# Patient Record
Sex: Male | Born: 1975
Health system: Southern US, Community
[De-identification: ages and names within clinical notes are randomized; demographics above are authoritative.]

## PROBLEM LIST (undated history)

## (undated) DIAGNOSIS — D497 Neoplasm of unspecified behavior of endocrine glands and other parts of nervous system: Secondary | ICD-10-CM

## (undated) DIAGNOSIS — B019 Varicella without complication: Secondary | ICD-10-CM

## (undated) HISTORY — DX: Varicella without complication: B01.9

---

## 1998-05-30 HISTORY — PX: HAND SURGERY: SHX662

## 2001-05-30 HISTORY — PX: APPENDECTOMY: SHX54

## 2013-07-15 ENCOUNTER — Ambulatory Visit: Payer: Self-pay | Admitting: Physician Assistant

## 2013-07-15 LAB — RAPID INFLUENZA A&B ANTIGENS

## 2014-03-24 ENCOUNTER — Ambulatory Visit: Payer: Self-pay | Admitting: Physician Assistant

## 2016-01-06 ENCOUNTER — Encounter: Payer: Self-pay | Admitting: Family Medicine

## 2016-01-06 ENCOUNTER — Ambulatory Visit (INDEPENDENT_AMBULATORY_CARE_PROVIDER_SITE_OTHER): Payer: 59

## 2016-01-06 ENCOUNTER — Ambulatory Visit (INDEPENDENT_AMBULATORY_CARE_PROVIDER_SITE_OTHER): Payer: 59 | Admitting: Family Medicine

## 2016-01-06 VITALS — BP 116/74 | HR 70 | Temp 98.3°F | Ht 71.75 in | Wt 229.5 lb

## 2016-01-06 DIAGNOSIS — Z130 Encounter for screening for diseases of the blood and blood-forming organs and certain disorders involving the immune mechanism: Secondary | ICD-10-CM

## 2016-01-06 DIAGNOSIS — Z0001 Encounter for general adult medical examination with abnormal findings: Secondary | ICD-10-CM | POA: Diagnosis not present

## 2016-01-06 DIAGNOSIS — R200 Anesthesia of skin: Secondary | ICD-10-CM

## 2016-01-06 DIAGNOSIS — R202 Paresthesia of skin: Secondary | ICD-10-CM

## 2016-01-06 DIAGNOSIS — E669 Obesity, unspecified: Secondary | ICD-10-CM | POA: Diagnosis not present

## 2016-01-06 NOTE — Patient Instructions (Addendum)
We will call with your results.  Return for your testosterone (has been first thing in the morning).   Follow up annually  Take care  Dr. Lacinda Axon   Health Maintenance, Male A healthy lifestyle and preventative care can promote health and wellness.  Maintain regular health, dental, and eye exams.  Eat a healthy diet. Foods like vegetables, fruits, whole grains, low-fat dairy products, and lean protein foods contain the nutrients you need and are low in calories. Decrease your intake of foods high in solid fats, added sugars, and salt. Get information about a proper diet from your health care provider, if necessary.  Regular physical exercise is one of the most important things you can do for your health. Most adults should get at least 150 minutes of moderate-intensity exercise (any activity that increases your heart rate and causes you to sweat) each week. In addition, most adults need muscle-strengthening exercises on 2 or more days a week.   Maintain a healthy weight. The body mass index (BMI) is a screening tool to identify possible weight problems. It provides an estimate of body fat based on height and weight. Your health care provider can find your BMI and can help you achieve or maintain a healthy weight. For males 20 years and older:  A BMI below 18.5 is considered underweight.  A BMI of 18.5 to 24.9 is normal.  A BMI of 25 to 29.9 is considered overweight.  A BMI of 30 and above is considered obese.  Maintain normal blood lipids and cholesterol by exercising and minimizing your intake of saturated fat. Eat a balanced diet with plenty of fruits and vegetables. Blood tests for lipids and cholesterol should begin at age 81 and be repeated every 5 years. If your lipid or cholesterol levels are high, you are over age 14, or you are at high risk for heart disease, you may need your cholesterol levels checked more frequently.Ongoing high lipid and cholesterol levels should be treated  with medicines if diet and exercise are not working.  If you smoke, find out from your health care provider how to quit. If you do not use tobacco, do not start.  Lung cancer screening is recommended for adults aged 42-80 years who are at high risk for developing lung cancer because of a history of smoking. A yearly low-dose CT scan of the lungs is recommended for people who have at least a 30-pack-year history of smoking and are current smokers or have quit within the past 15 years. A pack year of smoking is smoking an average of 1 pack of cigarettes a day for 1 year (for example, a 30-pack-year history of smoking could mean smoking 1 pack a day for 30 years or 2 packs a day for 15 years). Yearly screening should continue until the smoker has stopped smoking for at least 15 years. Yearly screening should be stopped for people who develop a health problem that would prevent them from having lung cancer treatment.  If you choose to drink alcohol, do not have more than 2 drinks per day. One drink is considered to be 12 oz (360 mL) of beer, 5 oz (150 mL) of wine, or 1.5 oz (45 mL) of liquor.  Avoid the use of street drugs. Do not share needles with anyone. Ask for help if you need support or instructions about stopping the use of drugs.  High blood pressure causes heart disease and increases the risk of stroke. High blood pressure is more likely to develop  in:  People who have blood pressure in the end of the normal range (100-139/85-89 mm Hg).  People who are overweight or obese.  People who are African American.  If you are 59-63 years of age, have your blood pressure checked every 3-5 years. If you are 58 years of age or older, have your blood pressure checked every year. You should have your blood pressure measured twice--once when you are at a hospital or clinic, and once when you are not at a hospital or clinic. Record the average of the two measurements. To check your blood pressure when you  are not at a hospital or clinic, you can use:  An automated blood pressure machine at a pharmacy.  A home blood pressure monitor.  If you are 36-75 years old, ask your health care provider if you should take aspirin to prevent heart disease.  Diabetes screening involves taking a blood sample to check your fasting blood sugar level. This should be done once every 3 years after age 63 if you are at a normal weight and without risk factors for diabetes. Testing should be considered at a younger age or be carried out more frequently if you are overweight and have at least 1 risk factor for diabetes.  Colorectal cancer can be detected and often prevented. Most routine colorectal cancer screening begins at the age of 42 and continues through age 53. However, your health care provider may recommend screening at an earlier age if you have risk factors for colon cancer. On a yearly basis, your health care provider may provide home test kits to check for hidden blood in the stool. A small camera at the end of a tube may be used to directly examine the colon (sigmoidoscopy or colonoscopy) to detect the earliest forms of colorectal cancer. Talk to your health care provider about this at age 84 when routine screening begins. A direct exam of the colon should be repeated every 5-10 years through age 77, unless early forms of precancerous polyps or small growths are found.  People who are at an increased risk for hepatitis B should be screened for this virus. You are considered at high risk for hepatitis B if:  You were born in a country where hepatitis B occurs often. Talk with your health care provider about which countries are considered high risk.  Your parents were born in a high-risk country and you have not received a shot to protect against hepatitis B (hepatitis B vaccine).  You have HIV or AIDS.  You use needles to inject street drugs.  You live with, or have sex with, someone who has hepatitis  B.  You are a man who has sex with other men (MSM).  You get hemodialysis treatment.  You take certain medicines for conditions like cancer, organ transplantation, and autoimmune conditions.  Hepatitis C blood testing is recommended for all people born from [birth year] through 1965 and any individual with known risk factors for hepatitis C.  Healthy men should no longer receive prostate-specific antigen (PSA) blood tests as part of routine cancer screening. Talk to your health care provider about prostate cancer screening.  Testicular cancer screening is not recommended for adolescents or adult males who have no symptoms. Screening includes self-exam, a health care provider exam, and other screening tests. Consult with your health care provider about any symptoms you have or any concerns you have about testicular cancer.  Practice safe sex. Use condoms and avoid high-risk sexual practices to reduce the  spread of sexually transmitted infections (STIs).  You should be screened for STIs, including gonorrhea and chlamydia if:  You are sexually active and are younger than 24 years.  You are older than 24 years, and your health care provider tells you that you are at risk for this type of infection.  Your sexual activity has changed since you were last screened, and you are at an increased risk for chlamydia or gonorrhea. Ask your health care provider if you are at risk.  If you are at risk of being infected with HIV, it is recommended that you take a prescription medicine daily to prevent HIV infection. This is called pre-exposure prophylaxis (PrEP). You are considered at risk if:  You are a man who has sex with other men (MSM).  You are a heterosexual man who is sexually active with multiple partners.  You take drugs by injection.  You are sexually active with a partner who has HIV.  Talk with your health care provider about whether you are at high risk of being infected with HIV. If you  choose to begin PrEP, you should first be tested for HIV. You should then be tested every 3 months for as long as you are taking PrEP.  Use sunscreen. Apply sunscreen liberally and repeatedly throughout the day. You should seek shade when your shadow is shorter than you. Protect yourself by wearing long sleeves, pants, a wide-brimmed hat, and sunglasses year round whenever you are outdoors.  Tell your health care provider of new moles or changes in moles, especially if there is a change in shape or color. Also, tell your health care provider if a mole is larger than the size of a pencil eraser.  A one-time screening for abdominal aortic aneurysm (AAA) and surgical repair of large AAAs by ultrasound is recommended for men aged 49-75 years who are current or former smokers.  Stay current with your vaccines (immunizations).   This information is not intended to replace advice given to you by your health care provider. Make sure you discuss any questions you have with your health care provider.   Document Released: 11/12/2007 Document Revised: 06/06/2014 Document Reviewed: 10/11/2010 Elsevier Interactive Patient Education [employer].

## 2016-01-06 NOTE — Assessment & Plan Note (Signed)
Tetanus up-to-date. Screening labs today. We do not yet have flu vaccines.

## 2016-01-06 NOTE — Progress Notes (Signed)
 Subjective:  Patient ID: Jonathan Day, male    DOB: 06/25/1975  Age: 40 y.o. MRN: [id number]  CC: Establish care, Neck pain/thumb numbness  HPI Jonathan Day is a 40 y.o. male presents to the clinic today with the above complaints/issues.  Preventative Healthcare  Immunizations  Tetanus - Up to date.  Pneumococcal - Not indicated.   Flu - We don't have vaccine yet.  Prostate cancer screening: Not indicated at this time.  Labs: Screening labs today.  Exercise: No regular exercise.   Alcohol use: Yes.   Smoking/tobacco use: Nonsmoker.  Neck pain/R thumb numbness  Patient reports that he's had neck pain and associated right thumb numbness and tingling for the past 2 weeks. Intermittently severe.   No fall, trauma, injury.  Patient feels like his numbness starts in the cervical spine and radiates downward.  No known exacerbating or relieving factors.  No reported weakness or muscle atrophy.  No medications or interventions tried.  No other complaints this time.  PMH, Surgical Hx, Family Hx, Social History reviewed and updated as below.  Past Medical History:  Diagnosis Date  . Chicken pox    Past Surgical History:  Procedure Laterality Date  . APPENDECTOMY  2003  . HAND SURGERY Right 2000   Family History  Problem Relation Age of Onset  . Diabetes Mother   . Hyperlipidemia Mother   . Diabetes Maternal Grandmother   . Hypertension Maternal Grandfather    Social History  Substance Use Topics  . Smoking status: Never Smoker  . Smokeless tobacco: Never Used  . Alcohol use Yes   Review of Systems  Constitutional: Positive for fatigue.  Musculoskeletal: Positive for neck pain.  Neurological: Positive for numbness.  All other systems reviewed and are negative.  Objective:   Today's Vitals: BP 116/74 (BP Location: Right Arm, Patient Position: Sitting, Cuff Size: Normal)   Pulse 70   Temp 98.3 F (36.8 C) (Oral)   Ht 5' 11.75" (1.822 m)    Wt 229 lb 8 oz (104.1 kg)   SpO2 98%   BMI 31.34 kg/m   Physical Exam  Constitutional: He is oriented to person, place, and time. He appears well-developed. No distress.  HENT:  Head: Normocephalic and atraumatic.  Mouth/Throat: Oropharynx is clear and moist.  Eyes: Conjunctivae are normal.  Neck: Neck supple.  C spine with normal ROM. No tenderness.   Cardiovascular: Normal rate and regular rhythm.   No murmur heard. Pulmonary/Chest: Effort normal. He has no wheezes. He has no rales.  Abdominal: Soft. There is no tenderness. There is no rebound.  Musculoskeletal: Normal range of motion.  Neurological: He is alert and oriented to person, place, and time.  Normal upper extremity reflexes. Normal strength.  Psychiatric: He has a normal mood and affect.  Vitals reviewed.  Assessment & Plan:   Problem List Items Addressed This Visit    Encounter for preventative adult health care exam with abnormal findings - Primary    Tetanus up-to-date. Screening labs today. We do not yet have flu vaccines.      Numbness and tingling of right thumb    New problem. History suggestive of cervical radiculopathy. Obtaining x-ray today.       Relevant Orders   DG Cervical Spine Complete    Other Visit Diagnoses    Screening for deficiency anemia       Relevant Orders   CBC   Obesity (BMI 30.0-34.9)       Relevant Orders     Comp Met (CMET)   Lipid Profile   HgB A1c     Follow-up: Annually or sooner if needed (xray pending)  Jayce Cook DO [facility] [place]     

## 2016-01-06 NOTE — Progress Notes (Signed)
Pre visit review using our clinic review tool, if applicable. No additional management support is needed unless otherwise documented below in the visit note. 

## 2016-01-06 NOTE — Assessment & Plan Note (Signed)
New problem. History suggestive of cervical radiculopathy. Obtaining x-ray today.

## 2016-01-07 ENCOUNTER — Other Ambulatory Visit: Payer: Self-pay | Admitting: Family Medicine

## 2016-01-07 DIAGNOSIS — M5412 Radiculopathy, cervical region: Secondary | ICD-10-CM

## 2016-01-07 LAB — LIPID PANEL
CHOL/HDL RATIO: 5
CHOLESTEROL: 214 mg/dL — AB (ref 0–200)
HDL: 39.5 mg/dL (ref >39.00)
LDL CALC: 152 mg/dL — AB (ref 0–99)
NONHDL: 174.93
Triglycerides: 114 mg/dL (ref 0.0–149.0)
VLDL: 22.8 mg/dL (ref 0.0–40.0)

## 2016-01-07 LAB — COMPREHENSIVE METABOLIC PANEL
ALBUMIN: 4.4 g/dL (ref 3.5–5.2)
ALT: 23 U/L (ref 0–53)
AST: 35 U/L (ref 0–37)
Alkaline Phosphatase: 63 U/L (ref 39–117)
BUN: 22 mg/dL (ref 6–23)
CALCIUM: 9.4 mg/dL (ref 8.4–10.5)
CO2: 28 mEq/L (ref 19–32)
Chloride: 103 mEq/L (ref 96–112)
Creatinine, Ser: 1.07 mg/dL (ref 0.40–1.50)
GFR: 81.22 mL/min (ref >60.00)
Glucose, Bld: 107 mg/dL — ABNORMAL HIGH (ref 70–99)
POTASSIUM: 4 meq/L (ref 3.5–5.1)
Sodium: 137 mEq/L (ref 135–145)
TOTAL PROTEIN: 7.6 g/dL (ref 6.0–8.3)
Total Bilirubin: 0.3 mg/dL (ref 0.2–1.2)

## 2016-01-07 LAB — CBC
HEMATOCRIT: 45.2 % (ref 39.0–52.0)
HEMOGLOBIN: 15.6 g/dL (ref 13.0–17.0)
MCHC: 34.6 g/dL (ref 30.0–36.0)
MCV: 90.2 fl (ref 78.0–100.0)
PLATELETS: 292 10*3/uL (ref 150.0–400.0)
RBC: 5.01 Mil/uL (ref 4.22–5.81)
RDW: 11.2 % — AB (ref 11.5–15.5)
WBC: 7.8 10*3/uL (ref 4.0–10.5)

## 2016-01-07 LAB — HEMOGLOBIN A1C: HEMOGLOBIN A1C: 5.4 % (ref 4.6–6.5)

## 2016-01-19 ENCOUNTER — Telehealth: Payer: Self-pay | Admitting: Family Medicine

## 2016-01-19 ENCOUNTER — Other Ambulatory Visit: Payer: Self-pay | Admitting: Family Medicine

## 2016-01-19 MED ORDER — GABAPENTIN 300 MG PO CAPS: 300.0000 mg | ORAL_CAPSULE | Freq: Three times a day (TID) | ORAL | 3 refills | Status: DC

## 2016-01-19 NOTE — Telephone Encounter (Signed)
Pt [place] wife called wanting to know if the MRI is neccesary? Pt insurance will not cover the MRI. Please advise?   Call wife @ [phone]. Thank you!

## 2016-01-19 NOTE — Telephone Encounter (Signed)
Please advise 

## 2016-01-19 NOTE — Telephone Encounter (Signed)
Patient wife requested to be called, to better explain the reason for her questioning the MRi.

## 2016-01-19 NOTE — Telephone Encounter (Signed)
I recommend the MRI given his symptoms and xray findings. Insurance declined (likely due to not trying medication for a period of time).

## 2016-01-20 NOTE — Telephone Encounter (Signed)
The insurance is Christella Scheuermann the number for dispute claims :[phone]  Please call wife with a questions [phone]

## 2016-01-20 NOTE — Telephone Encounter (Signed)
Please advise 

## 2016-01-20 NOTE — Telephone Encounter (Signed)
Patients wife spoke to [employer], you were correct.  Insurance would like to use conservative methods first.  But the provider did mention that since there was no mention of the bone spur, Dr. Lacinda Axon could call and speak to the provider at the insurance to rectify the situation.  Patient will be calling back within the hour to provide the number and extension for Dr. Lacinda Axon to call.

## 2016-01-20 NOTE — Telephone Encounter (Signed)
Can I have a CMA or Tanya call for me.  I cannot sit on the phone all day disputing a claim.

## 2016-01-21 NOTE — Telephone Encounter (Signed)
Called but was unable to leave message.

## 2016-01-22 NOTE — Telephone Encounter (Signed)
Left message for patient to return call back.  

## 2016-01-26 NOTE — Telephone Encounter (Signed)
Left message for patient to return call back.  

## 2016-02-03 ENCOUNTER — Telehealth: Payer: Self-pay | Admitting: *Deleted

## 2016-02-03 NOTE — Telephone Encounter (Signed)
Pt wife has further questions, please call today at [phone]

## 2016-02-03 NOTE — Telephone Encounter (Signed)
Patients wife is wondering if they can receive referral or medication to meet criteria to be able to receive MRI.  Patient was seen By Dr. Lacinda Axon for [place]. Please advise.

## 2016-02-03 NOTE — Telephone Encounter (Signed)
Patient called to schedule appointment for further treatment.

## 2016-02-04 ENCOUNTER — Other Ambulatory Visit: Payer: Self-pay | Admitting: Family Medicine

## 2016-02-04 NOTE — Telephone Encounter (Signed)
We can try a trial of gabapentin. Ok with that?

## 2016-02-04 NOTE — Telephone Encounter (Signed)
Yes that's ok. Patient will be making FU appointment at a later date for FU. To kinda start the process.

## 2016-02-04 NOTE — Telephone Encounter (Signed)
Rx has been sent  

## 2016-05-22 ENCOUNTER — Other Ambulatory Visit: Payer: Self-pay | Admitting: Family Medicine

## 2016-05-24 NOTE — Telephone Encounter (Signed)
Last OV 8/17 ok to fill Neurontin ?

## 2016-09-01 ENCOUNTER — Other Ambulatory Visit: Payer: Self-pay | Admitting: Family Medicine

## 2016-09-01 MED ORDER — GABAPENTIN 300 MG PO CAPS: 300.0000 mg | ORAL_CAPSULE | Freq: Three times a day (TID) | ORAL | 3 refills | Status: DC

## 2016-09-01 NOTE — Telephone Encounter (Signed)
Refilled: 05/25/16 Last OV: 01/06/16 Last Labs:  Future OV: none Please advise?

## 2016-10-19 ENCOUNTER — Other Ambulatory Visit: Payer: Self-pay | Admitting: Family Medicine

## 2017-02-14 ENCOUNTER — Encounter: Payer: Self-pay | Admitting: Family Medicine

## 2017-02-14 ENCOUNTER — Ambulatory Visit (INDEPENDENT_AMBULATORY_CARE_PROVIDER_SITE_OTHER): Payer: 59 | Admitting: Family Medicine

## 2017-02-14 DIAGNOSIS — D497 Neoplasm of unspecified behavior of endocrine glands and other parts of nervous system: Secondary | ICD-10-CM | POA: Insufficient documentation

## 2017-02-14 DIAGNOSIS — M5412 Radiculopathy, cervical region: Secondary | ICD-10-CM

## 2017-02-14 DIAGNOSIS — Z23 Encounter for immunization: Secondary | ICD-10-CM

## 2017-02-14 MED ORDER — PREDNISONE 10 MG (48) PO TBPK: ORAL_TABLET | ORAL | 0 refills | Status: DC

## 2017-02-14 MED ORDER — TRAMADOL HCL 50 MG PO TABS: 50.0000 mg | ORAL_TABLET | Freq: Three times a day (TID) | ORAL | 0 refills | Status: DC | PRN

## 2017-02-14 MED ORDER — SELENIUM SULFIDE 2.5 % EX LOTN: TOPICAL_LOTION | CUTANEOUS | 12 refills | Status: DC

## 2017-02-14 NOTE — Progress Notes (Signed)
Subjective:  Patient ID: Jonathan Day, male    DOB: 03-Aug-1975  Age: 41 y.o. MRN: [id number]  CC: Neck pain, numbness  HPI:  41 year old male presents with the above complaints.  Patient has seen me for this previously.  He states that he improved following treatment with gabapentin. He states that since Wednesday of last week, he's had an acute exacerbation. He's had right-sided neck pain with radiation down his arm. Associated paresthesias of the right arm and thumb. Severe. He is having trouble getting comfortable. He's tried over-the-counter ibuprofen as well as his gabapentin without resolution. He is also gotten a massage and no improvement. No reported weakness. No other associated symptoms. No other complaints at this time.  Social Hx   Social History   Social History  . Marital status: Married    Spouse name: N/A  . Number of children: N/A  . Years of education: N/A   Social History Main Topics  . Smoking status: Never Smoker  . Smokeless tobacco: Never Used  . Alcohol use Yes  . Drug use: No  . Sexual activity: Yes    Partners: Female   Other Topics Concern  . None   Social History Narrative  . None    Review of Systems  Constitutional: Negative.   Musculoskeletal: Positive for neck pain.  Neurological: Positive for numbness.   Objective:  BP 120/78 (BP Location: Left Arm, Patient Position: Sitting, Cuff Size: Normal)   Pulse 81   Temp 98.2 F (36.8 C) (Oral)   Resp 12   Wt 208 lb 4 oz (94.5 kg)   SpO2 98%   BMI 28.44 kg/m   BP/Weight 2/59/5638 12/02/6431  Systolic BP 295 188  Diastolic BP 78 74  Wt. (Lbs) 208.25 229.5  BMI 28.44 31.34    Physical Exam  Constitutional: He is oriented to person, place, and time. He appears well-developed.  Neck:  Decreased ROM of the neck.  Cardiovascular: Normal rate and regular rhythm.   Pulmonary/Chest: Effort normal. He has no wheezes. He has no rales.  Neurological: He is alert and oriented to  person, place, and time.  Psychiatric: He has a normal mood and affect.  Vitals reviewed.   Lab Results  Component Value Date   WBC 7.8 01/06/2016   HGB 15.6 01/06/2016   HCT 45.2 01/06/2016   PLT 292.0 01/06/2016   GLUCOSE 107 (H) 01/06/2016   CHOL 214 (H) 01/06/2016   TRIG 114.0 01/06/2016   HDL 39.50 01/06/2016   LDLCALC 152 (H) 01/06/2016   ALT 23 01/06/2016   AST 35 01/06/2016   NA 137 01/06/2016   K 4.0 01/06/2016   CL 103 01/06/2016   CREATININE 1.07 01/06/2016   BUN 22 01/06/2016   CO2 28 01/06/2016   HGBA1C 5.4 01/06/2016    Assessment & Plan:   Problem List Items Addressed This Visit    Cervical radiculopathy    Established problem, with recent worsening. Treating with prednisone taper, Tramadol. Arranging MRI.      Relevant Orders   MR Cervical Spine Wo Contrast    Other Visit Diagnoses    Need for immunization against influenza       Relevant Orders   Flu Vaccine QUAD 36+ mos IM (Completed)      Meds ordered this encounter  Medications  . CABERGOLINE PO    Sig: Take by mouth.  . selenium sulfide (SELSUN) 2.5 % shampoo    Sig: Apply to affected area and lather with small  amounts of water; leave on skin for 10 minutes, then rinse thoroughly    Dispense:  118 mL    Refill:  12  . predniSONE (STERAPRED UNI-PAK 48 TAB) 10 MG (48) TBPK tablet    Sig: Per package instructions; 12 day taper.    Dispense:  48 tablet    Refill:  0  . traMADol (ULTRAM) 50 MG tablet    Sig: Take 1 tablet (50 mg total) by mouth every 8 (eight) hours as needed.    Dispense:  30 tablet    Refill:  0   Follow-up: PRN  [place]

## 2017-02-14 NOTE — Assessment & Plan Note (Signed)
Established problem, with recent worsening. Treating with prednisone taper, Tramadol. Arranging MRI.

## 2017-02-14 NOTE — Patient Instructions (Signed)
Medications as prescribed.  We will call about the MRI.  Take care  Dr. Lacinda Axon

## 2017-02-23 ENCOUNTER — Telehealth: Payer: Self-pay | Admitting: *Deleted

## 2017-02-23 NOTE — Telephone Encounter (Signed)
Patient is requesting more prednisone. His taper will be gone before Monday. Patient is scheduled for MRI on 02/25/2017. Patient stated that the prednisone helped him before he tapered down to the 20 mg but he is going back to work Monday and he feels that the pain will get worse with all the picking up, bending, etc.

## 2017-02-23 NOTE — Telephone Encounter (Signed)
Patient has already had a prolonged steroid taper. I would not suggest a repeat steroid treatment at this time. There are risks with prolonged steroid use. He should continue with his other treatments for this and get the MRI as scheduled.

## 2017-02-23 NOTE — Telephone Encounter (Signed)
Pt has requested a call to discuss having another round of prednisone for his neck issues  Pt contact [phone]

## 2017-02-25 ENCOUNTER — Ambulatory Visit
Admission: RE | Admit: 2017-02-25 | Discharge: 2017-02-25 | Disposition: A | Discharge status: Home / Self Care | Payer: 59 | Source: Ambulatory Visit | Attending: Family Medicine | Admitting: Family Medicine

## 2017-02-25 DIAGNOSIS — M5412 Radiculopathy, cervical region: Secondary | ICD-10-CM | POA: Diagnosis present

## 2017-02-25 DIAGNOSIS — M4802 Spinal stenosis, cervical region: Secondary | ICD-10-CM | POA: Diagnosis not present

## 2017-02-25 DIAGNOSIS — M50122 Cervical disc disorder at C5-C6 level with radiculopathy: Secondary | ICD-10-CM | POA: Insufficient documentation

## 2017-02-28 ENCOUNTER — Telehealth: Payer: Self-pay | Admitting: *Deleted

## 2017-02-28 DIAGNOSIS — M5412 Radiculopathy, cervical region: Secondary | ICD-10-CM

## 2017-02-28 DIAGNOSIS — M5030 Other cervical disc degeneration, unspecified cervical region: Secondary | ICD-10-CM

## 2017-02-28 NOTE — Telephone Encounter (Signed)
Pt's wife has requested a call in reference to questions about the [place]  [phone] or [phone] -work

## 2017-02-28 NOTE — Telephone Encounter (Signed)
Pt's wife has requested to have a referral to a neurosurgeon in [place] , [place] [facility]- Dr. Josetta Huddle [phone]

## 2017-02-28 NOTE — Telephone Encounter (Signed)
Referral placed.

## 2017-03-02 NOTE — Telephone Encounter (Signed)
Pt's wife requested a call [phone]

## 2017-03-02 NOTE — Telephone Encounter (Signed)
Pt wife called wanting to know if pt can be put on a steroid taper until his appt? To release pressure and swelling on the nerve. Pt is having continuous numbness with right thumb. Please advise?  Call wife @ [phone]. Thank you!

## 2017-03-02 NOTE — Telephone Encounter (Signed)
Left voice mail to call back 

## 2017-03-02 NOTE — Telephone Encounter (Signed)
Patients wife advised of below and verbalized understanding  

## 2017-03-02 NOTE — Telephone Encounter (Signed)
Please find out when his appointment is. I would not recommend going on a steroid taper for any significant period of time. I'm happy to reevaluate him in the office to consider other treatments. If patient develops numbness other than just in his thumb he needs to be evaluated or if he develops any new symptoms he needs to be evaluated.

## 2017-03-02 NOTE — Telephone Encounter (Signed)
Spoke with patient advised of below.  He doesn't have appointment scheduled with neurosurgery yet, they're still reviewing records.   Patient states at times pain is in palm of hand and all fingers. Neurontin not really helping control pain.  Appointment scheduled for Wednesday.

## 2017-03-04 NOTE — Telephone Encounter (Signed)
Plan to evaluate in the office.

## 2017-03-07 ENCOUNTER — Telehealth: Payer: Self-pay | Admitting: Family Medicine

## 2017-03-07 NOTE — Telephone Encounter (Signed)
Copy of CD has been made and put up front in folder for pt to pick up.

## 2017-03-07 NOTE — Telephone Encounter (Signed)
Pt spouse called and is requesting a copy of his last xray as he as an appt with a specialist. Pt has an appt with Dr. Caryl Bis tomorrow. Can we have ready by his appt? Please advise, thank you!

## 2017-03-08 ENCOUNTER — Ambulatory Visit (INDEPENDENT_AMBULATORY_CARE_PROVIDER_SITE_OTHER): Payer: 59 | Admitting: Family Medicine

## 2017-03-08 ENCOUNTER — Encounter: Payer: Self-pay | Admitting: Family Medicine

## 2017-03-08 DIAGNOSIS — M5412 Radiculopathy, cervical region: Secondary | ICD-10-CM

## 2017-03-08 MED ORDER — PREDNISONE 10 MG PO TABS: ORAL_TABLET | ORAL | 0 refills | Status: DC

## 2017-03-08 MED ORDER — CYCLOBENZAPRINE HCL 10 MG PO TABS: 10.0000 mg | ORAL_TABLET | Freq: Three times a day (TID) | ORAL | 0 refills | Status: DC | PRN

## 2017-03-08 NOTE — Assessment & Plan Note (Signed)
Established problem. Nerve impingement is the likely cause based on MRI. Stable from most recent exam with Dr. Lacinda Axon. It improved somewhat with prednisone taper though recurred. Does have numbness in his right thumb though no weakness. Patient reports he has an appointment with orthopedics early next week for evaluation. Also had an appointment with neurosurgery though this is not until the end of November. We will trial another short taper of prednisone. Trial Flexeril as a muscle relaxer. Discussed that if he were to develop any weakness or worsening symptoms to be evaluated immediately.

## 2017-03-08 NOTE — Progress Notes (Signed)
[name], MD Phone: [phone]  Jonathan Day is a 41 y.o. male who presents today for follow-up.  Patient previously seen by Dr. Lacinda Axon for neck pain that radiates down towards his right thumb through his right arm. He had an MRI that revealed degenerative disc changes with moderate right neural foraminal stenosis at C5-C6. He had similar symptoms 8 or 9 months ago though had no pain. His arm would fall sleep at that time. He had no issues for a while on gabapentin though a little over a month ago started to have issues with pain in his neck. He woke up one day with pain in his neck that would shoot down towards his right thumb. He'll stretch and it will knees off. Work intensifies it. He was treated with a prednisone taper that was beneficial for several days last month though the issue came back. He notes numbness in his right thumb that is persistent. Feels like pins and needles. Notes on one or 2 occasions has felt possible weakness though he thinks it's related to the numbness.  ROS see history of present illness  Objective  Physical Exam Vitals:   03/08/17 1303  BP: 128/90  Pulse: 69  Temp: 98 F (36.7 C)  SpO2: 98%    BP Readings from Last 3 Encounters:  03/08/17 128/90  02/14/17 120/78  01/06/16 116/74   Wt Readings from Last 3 Encounters:  03/08/17 207 lb 6.4 oz (94.1 kg)  02/14/17 208 lb 4 oz (94.5 kg)  01/06/16 229 lb 8 oz (104.1 kg)    Physical Exam  Constitutional: No distress.  HENT:  Head: Normocephalic and atraumatic.  Cardiovascular: Normal rate, regular rhythm and normal heart sounds.   Pulmonary/Chest: Effort normal and breath sounds normal.  Musculoskeletal:  No midline neck tenderness, no midline neck step-off, no muscular neck tenderness, discomfort on rotational range of motion of the right, no discomfort other range of motion, rotational range of motion of the right slightly decreased related to discomfort, Spurling's with discomfort in  his neck though nothing shooting down his arm on the right  Neurological: He is alert.  5/5 strength in bilateral biceps, triceps, grip, quads, hamstrings, plantar and dorsiflexion, slight decreased light touch sensation in right thumb otherwise sensation to light touch intact in bilateral UE and LE  Skin: He is not diaphoretic.     Assessment/Plan: Please see individual problem list.  Cervical radiculopathy Established problem. Nerve impingement is the likely cause based on MRI. Stable from most recent exam with Dr. Lacinda Axon. It improved somewhat with prednisone taper though recurred. Does have numbness in his right thumb though no weakness. Patient reports he has an appointment with orthopedics early next week for evaluation. Also had an appointment with neurosurgery though this is not until the end of November. We will trial another short taper of prednisone. Trial Flexeril as a muscle relaxer. Discussed that if he were to develop any weakness or worsening symptoms to be evaluated immediately.   No orders of the defined types were placed in this encounter.   Meds ordered this encounter  Medications  . cyclobenzaprine (FLEXERIL) 10 MG tablet    Sig: Take 1 tablet (10 mg total) by mouth 3 (three) times daily as needed for muscle spasms.    Dispense:  30 tablet    Refill:  0  . predniSONE (DELTASONE) 10 MG tablet    Sig: Take 50 mg (5 tablets) by mouth for 2 days, then take 40 mg (4 tablets) by  mouth for 2 days, then take 30 mg (3 tablets) by mouth for 2 days, then take 20 mg (2 tablets) by mouth for 2 days, then take 10 mg (one tablet) by mouth for 2 days    Dispense:  30 tablet    Refill:  0   [name], MD [place]

## 2017-03-08 NOTE — Patient Instructions (Signed)
Nice to see you. Please keep your appointment with orthopedics next week. Please try the prednisone taper. You can try the muscle relaxer as well. This may make you drowsy so be careful. Please do not take any over-the-counter anti-inflammatories while taking the prednisone.

## 2017-03-20 ENCOUNTER — Other Ambulatory Visit: Payer: Self-pay | Admitting: Family Medicine

## 2017-03-21 MED ORDER — TRAMADOL HCL 50 MG PO TABS: 50.0000 mg | ORAL_TABLET | Freq: Three times a day (TID) | ORAL | 0 refills | Status: DC | PRN

## 2017-03-21 MED ORDER — CYCLOBENZAPRINE HCL 10 MG PO TABS: 10.0000 mg | ORAL_TABLET | Freq: Three times a day (TID) | ORAL | 0 refills | Status: DC | PRN

## 2017-03-21 NOTE — Telephone Encounter (Signed)
Last OV was 10/10, Please advise, thanks

## 2017-03-21 NOTE — Telephone Encounter (Signed)
LAst seen on 10/10, please advise thanks

## 2017-04-07 ENCOUNTER — Telehealth: Payer: Self-pay | Admitting: Family Medicine

## 2017-04-07 NOTE — Telephone Encounter (Signed)
Can we place referral to have this done?

## 2017-04-07 NOTE — Telephone Encounter (Signed)
Copied from [place] #[id number]. Topic: Quick Communication - See Telephone Encounter >> Apr 07, 2017  9:30 AM Burnis Medin, NT wrote: CRM for notification. See Telephone encounter for: Patients wife called and wanted to see if her husband can come in for an appointment to get his sperm count checked or if he have to go to a specialist. Pt. Had a tumor and was talking medication that causes his hormones to go back to normal. Pt. Would like a call back.  04/07/17.

## 2017-04-07 NOTE — Telephone Encounter (Signed)
Please contact the patient's wife and get more details regarding this.  Are they referring to the pituitary tumor?  What medication they referring to?  I prefer to refer him out for this.  Thanks.

## 2017-04-07 NOTE — Telephone Encounter (Signed)
LMTCB CRM started

## 2017-04-10 ENCOUNTER — Encounter: Payer: Self-pay | Admitting: *Deleted

## 2017-04-10 NOTE — Telephone Encounter (Signed)
Pt's wife, Junie Panning called concerning follow up of her husband's sperm count since it had been a year since he was on medication, taborgoline, to treat pituitary tumor. Pt wanted to know if she needed a referral and if so would the office would be wiling to give the referral or if this was something that could be done at the office. Pt can be contacted via Mychart due to work schedule and inability to answer phone at work or pt's wife Junie Panning, can be contacted at [phone], for further questions regarding the issue.

## 2017-04-11 ENCOUNTER — Other Ambulatory Visit: Payer: Self-pay | Admitting: Family Medicine

## 2017-04-13 NOTE — Telephone Encounter (Signed)
Last OV 03/08/17 last filled  Cyclobenzaprine 03/21/17 30 0rf Tramadol 03/21/17 30 0rf

## 2017-04-14 NOTE — Telephone Encounter (Signed)
Patient states he will call neurosurgery

## 2017-04-14 NOTE — Telephone Encounter (Signed)
Per review of Dr Ellen Henri last note, this was given for acute worsening pain.  Per note, pt had appt scheduled with ortho the next week.  If evaluated by ortho (and per note neurosurgery), I assume they are following and controlling pain med  Let me know if problems.

## 2017-04-14 NOTE — Telephone Encounter (Signed)
Patient has one day of medication left

## 2017-04-15 ENCOUNTER — Other Ambulatory Visit: Payer: Self-pay | Admitting: Family Medicine

## 2017-04-19 ENCOUNTER — Telehealth: Payer: Self-pay

## 2017-04-19 NOTE — Telephone Encounter (Signed)
FYI   Copied from [place] [id number]. Topic: General - Other >> Apr 19, 2017  3:46 PM Vernona Rieger wrote: [facility] called and said the Dr at their office is not filling his pain medicine, he is not currently under his care. Dr Caryl Bis would have to resume his med prescribing. Please advise.  He is no longer following up anymore with the Neuro Dr. Marcelline Deist office said you can call them back if you have questions but she just wanted Dr Caryl Bis to know. Vayas

## 2017-04-20 ENCOUNTER — Other Ambulatory Visit: Payer: Self-pay | Admitting: Family Medicine

## 2017-04-21 ENCOUNTER — Other Ambulatory Visit: Payer: Self-pay | Admitting: Family Medicine

## 2017-04-25 NOTE — Telephone Encounter (Signed)
Last OV 03/08/17 last filled  Tramadol 03/21/17 30 0rf cyclobenzaprine 03/21/17 30 0rf

## 2017-04-25 NOTE — Telephone Encounter (Signed)
Noted.  Meds refilled.

## 2017-04-25 NOTE — Telephone Encounter (Signed)
It is not testing we typically do in the office.  Did he have testing for low sperm count done previously?  We can certainly try to find a place to send him to for this issue and I will send a message to [facility] to see if she knows of somewhere where he can have this testing done.  Thanks.

## 2017-04-25 NOTE — Telephone Encounter (Signed)
Refills given.  Please fax to pharmacy.  Please check with the patient to see how he is doing with his symptoms.  Please see if the patient's symptoms have improved with physical therapy.

## 2017-04-26 NOTE — Telephone Encounter (Signed)
Patient states he has improved but still has some numbness. Patient states he has some bad days but not like it was.

## 2017-04-26 NOTE — Telephone Encounter (Signed)
Yes, patient was wanting to go for testing for a sperm count. He said he previously discussed in an appointment with you.

## 2017-04-27 NOTE — Telephone Encounter (Signed)
He may have discussed this with Dr Lacinda Axon. I will send this to Melissa to try to figure out where to send him for this. I think he may just need to follow up with endocrinology.

## 2017-04-28 NOTE — Telephone Encounter (Signed)
He would need to go to endo or reproductive endocrinology and infertility clinic. Referral isn't necessary for this, they can call anyone they want to schedule an appointment. [facility] has an excellent REI clinic. I don't see anything in his notes regarding sperm count.

## 2017-04-28 NOTE — Telephone Encounter (Signed)
Please let the patient know that they should contact the endocrinlogy office that he saw at [facility] previously to see if he can be seen for this there. He does not need a referral. Thanks.

## 2017-05-01 ENCOUNTER — Encounter: Payer: Self-pay | Admitting: *Deleted

## 2017-05-01 NOTE — Telephone Encounter (Signed)
MYchart message sent.

## 2017-06-22 ENCOUNTER — Other Ambulatory Visit: Payer: Self-pay | Admitting: Family Medicine

## 2017-06-23 ENCOUNTER — Telehealth: Payer: Self-pay

## 2017-06-23 MED ORDER — TRAMADOL HCL 50 MG PO TABS: 50.0000 mg | ORAL_TABLET | Freq: Three times a day (TID) | ORAL | 0 refills | Status: DC | PRN

## 2017-06-23 MED ORDER — CYCLOBENZAPRINE HCL 10 MG PO TABS: 10.0000 mg | ORAL_TABLET | Freq: Three times a day (TID) | ORAL | 0 refills | Status: DC | PRN

## 2017-06-23 NOTE — Telephone Encounter (Signed)
Please fax.  I would suggest follow-up if he is continuing to have issues with his neck.  Thanks.

## 2017-06-23 NOTE — Telephone Encounter (Signed)
Last OV 03/08/17 last filled  Tramadol 04/25/17 30 0rf Cyclobenzaprine 04/25/17 30 0rf

## 2017-06-23 NOTE — Telephone Encounter (Signed)
Copied from [place] [id number]. Topic: Quick Communication - See Telephone Encounter >> Jun 23, 2017  7:03 AM Hewitt Shorts wrote: CRM for notification. See Telephone encounter for:  Pt is asking for an AWV and a normal office visit but a CRM needs to be added to patient in order to schedule appt for AWV  06/23/17. >> Jun 23, 2017  8:40 AM Larey Days wrote: The patient is 42yrs old he does not need a AWV . I left a message to give me a call back to schedule his physical and office visit.

## 2017-06-26 ENCOUNTER — Encounter: Payer: Self-pay | Admitting: *Deleted

## 2017-06-26 NOTE — Telephone Encounter (Signed)
Sent mychart message

## 2017-08-15 ENCOUNTER — Other Ambulatory Visit: Payer: Self-pay

## 2017-08-15 NOTE — Telephone Encounter (Signed)
Last OV 03/08/17 last filled 09/01/16 by Dr.Cook 90 3rf

## 2017-08-15 NOTE — Telephone Encounter (Signed)
Please see if he is continuing to take this given that it has not been refilled in some time.  Thanks.

## 2017-08-16 NOTE — Telephone Encounter (Signed)
Patient states he did not request this.

## 2017-08-18 ENCOUNTER — Other Ambulatory Visit: Payer: Self-pay

## 2017-08-18 ENCOUNTER — Encounter: Payer: Self-pay | Admitting: Family Medicine

## 2017-08-18 ENCOUNTER — Encounter: Payer: Self-pay | Admitting: *Deleted

## 2017-08-18 ENCOUNTER — Ambulatory Visit: Payer: 59 | Admitting: Family Medicine

## 2017-08-18 VITALS — BP 130/88 | HR 97 | Temp 98.1°F | Ht 72.0 in | Wt 214.6 lb

## 2017-08-18 DIAGNOSIS — R7309 Other abnormal glucose: Secondary | ICD-10-CM

## 2017-08-18 DIAGNOSIS — R030 Elevated blood-pressure reading, without diagnosis of hypertension: Secondary | ICD-10-CM | POA: Diagnosis not present

## 2017-08-18 DIAGNOSIS — Z125 Encounter for screening for malignant neoplasm of prostate: Secondary | ICD-10-CM

## 2017-08-18 DIAGNOSIS — E785 Hyperlipidemia, unspecified: Secondary | ICD-10-CM

## 2017-08-18 DIAGNOSIS — M5412 Radiculopathy, cervical region: Secondary | ICD-10-CM

## 2017-08-18 MED ORDER — CYCLOBENZAPRINE HCL 10 MG PO TABS: 10.0000 mg | ORAL_TABLET | Freq: Three times a day (TID) | ORAL | 0 refills | Status: DC | PRN

## 2017-08-18 NOTE — Assessment & Plan Note (Signed)
Discussed prostate cancer screening beginning around age 42 given his ethnicity leading to increased risk for prostate cancer.  Discussed the risks and the benefits of testing and the limitations of testing.  He opted for checking a PSA.

## 2017-08-18 NOTE — Patient Instructions (Signed)
Nice to see you. Please let us know if your insurance covers massage and I can provide you with a letter. We will contact our sports medicine physician and see what they think about seeing you. If you develop worsening symptoms or you develop weakness please let us or your surgeons know.

## 2017-08-18 NOTE — Assessment & Plan Note (Signed)
Pretty significantly improved though still occasionally having symptoms.  He will continue physical therapy.  Flexeril refill given.  We will send a message to sports medicine to see if the patient would potentially benefit from seeing them and considering manipulation through sports medicine.

## 2017-08-18 NOTE — Assessment & Plan Note (Signed)
Blood pressure borderline elevated for age initially.  Improved on recheck.  Will start checking it at home consistently.  He has not been able to be as active as prior given his recent neck issues.  He will follow-up in 3 months and see what his blood pressure has been doing.  If persistently elevated at home he will contact us prior to then.

## 2017-08-18 NOTE — Progress Notes (Signed)
[name], MD Phone: [phone]  Jonathan Day is a 42 y.o. male who presents today for f/u.  He notes his neck is doing quite a bit better.  He still has some numbness in the dorsal aspect of his right thumb though the tingling has significantly improved.  The numbness is improved as well.  No weakness.  Has been seeing physical therapy and they have been doing electrical stimulation as well as dry needling.  Also manual traction.  He did see neurosurgery as well as orthopedics and they both said he was a candidate for surgery though recommended physical therapy first.  Notes it will occasionally bother him a little more if he sleeps on it incorrectly.  He is thinking about getting a different pillow that cups has head a little bit better.  He takes the Flexeril prior to treatment at times.  Tramadol rarely.  Neither of these make him drowsy.  He wonders if there is anything else he can do for this.  Blood pressure borderline today.  No history of hypertension.  No chest pain or shortness of breath.  Social History   Tobacco Use  Smoking Status Never Smoker  Smokeless Tobacco Never Used     ROS see history of present illness  Objective  Physical Exam Vitals:   08/18/17 0826 08/18/17 0855  BP: 140/70 130/88  Pulse: 97   Temp: 98.1 F (36.7 C)   SpO2: 99%     BP Readings from Last 3 Encounters:  08/18/17 130/88  03/08/17 128/90  02/14/17 120/78   Wt Readings from Last 3 Encounters:  08/18/17 214 lb 9.6 oz (97.3 kg)  03/08/17 207 lb 6.4 oz (94.1 kg)  02/14/17 208 lb 4 oz (94.5 kg)    Physical Exam  Constitutional: No distress.  Cardiovascular: Normal rate, regular rhythm and normal heart sounds.  Pulmonary/Chest: Effort normal and breath sounds normal.  Musculoskeletal: He exhibits no edema.  No midline spine tenderness, no midline spine step-off, there is muscular tenderness and spasm in the midportion of his right trapezius muscle, no other tenderness,  5/5 strength in bilateral biceps, triceps, grip, quads, hamstrings, plantar and dorsiflexion, slight decreased sensation light touch over the dorsal aspect of his right thumb, otherwise sensation to light touch intact in bilateral UE and LE  Neurological: He is alert. Gait normal.  Skin: Skin is warm and dry. He is not diaphoretic.     Assessment/Plan: Please see individual problem list.  Cervical radiculopathy Pretty significantly improved though still occasionally having symptoms.  He will continue physical therapy.  Flexeril refill given.  We will send a message to sports medicine to see if the patient would potentially benefit from seeing them and considering manipulation through sports medicine.  Borderline blood pressure Blood pressure borderline elevated for age initially.  Improved on recheck.  Will start checking it at home consistently.  He has not been able to be as active as prior given his recent neck issues.  He will follow-up in 3 months and see what his blood pressure has been doing.  If persistently elevated at home he will contact us prior to then.  Prostate cancer screening Discussed prostate cancer screening beginning around age 60 given his ethnicity leading to increased risk for prostate cancer.  Discussed the risks and the benefits of testing and the limitations of testing.  He opted for checking a PSA.  Orders Placed This Encounter  Procedures  . Lipid panel    Standing Status:   Future  Standing Expiration Date:   08/19/2018  . Comp Met (CMET)    Standing Status:   Future    Standing Expiration Date:   08/19/2018  . HgB A1c    Standing Status:   Future    Standing Expiration Date:   08/19/2018  . PSA    Standing Status:   Future    Standing Expiration Date:   08/19/2018    Meds ordered this encounter  Medications  . cyclobenzaprine (FLEXERIL) 10 MG tablet    Sig: Take 1 tablet (10 mg total) by mouth 3 (three) times daily as needed for muscle spasms.     Dispense:  30 tablet    Refill:  0     [name], MD [place]

## 2017-08-19 ENCOUNTER — Other Ambulatory Visit: Payer: Self-pay | Admitting: Family Medicine

## 2017-08-20 ENCOUNTER — Telehealth: Payer: Self-pay | Admitting: Family Medicine

## 2017-08-20 DIAGNOSIS — M5412 Radiculopathy, cervical region: Secondary | ICD-10-CM

## 2017-08-20 NOTE — Telephone Encounter (Signed)
Please let the patient know I heard back from Dr. Tamala Julian of sports medicine and he noted he felt he likely would be able to help the patient.  I have placed a referral.  He should be contacted regarding this in the next week or so.  Thanks.

## 2017-08-20 NOTE — Telephone Encounter (Signed)
-----   Message from Lyndal Pulley, DO sent at 08/19/2017  1:34 PM EDT ----- Marykay Lex man! Yeah he would likely be a good candidate, I still do a ton of it.  Send him my way and I will do what I can! Thanks Thedore Mins  ----- Message ----- From: Leone Haven, MD Sent: 08/18/2017  12:16 PM To: Lyndal Pulley, DO  Langston Reusing,   I hope all is well. I wanted to see if you are doing manipulation in clinic? If so, I wanted to see if you thought it would be beneficial for this patient to see you in clinic. He has had right arm radicular symptoms for a number of months associated with right sided neck pain. Notes prior pain and numbness in to his right dorsal thumb radiating from his neck. He underwent MRI that revealed right sided C6 nerve impingement. He saw ortho and neurosurgery and they both said he was a candidate for surgical intervention though recommended PT and conservative treatment first. He has improved with these measures, though seems to have stalled with occasional discomfort and mild continued numbness in the dorsal aspect of his right thumb. He wondered if there was anything else to try at this point. I though he might benefit from manipulation, though wanted to get your input prior to referring to you. Thanks for your help.  Randall Hiss

## 2017-08-21 NOTE — Telephone Encounter (Signed)
Left message to return call, ok for pec to speak to patient 

## 2017-08-29 NOTE — Telephone Encounter (Signed)
Patient notified

## 2017-09-05 ENCOUNTER — Ambulatory Visit: Payer: 59 | Admitting: Family Medicine

## 2017-09-07 ENCOUNTER — Other Ambulatory Visit: Payer: 59

## 2017-09-12 ENCOUNTER — Other Ambulatory Visit (INDEPENDENT_AMBULATORY_CARE_PROVIDER_SITE_OTHER): Payer: 59

## 2017-09-12 DIAGNOSIS — E785 Hyperlipidemia, unspecified: Secondary | ICD-10-CM | POA: Diagnosis not present

## 2017-09-12 DIAGNOSIS — Z125 Encounter for screening for malignant neoplasm of prostate: Secondary | ICD-10-CM | POA: Diagnosis not present

## 2017-09-12 DIAGNOSIS — R7309 Other abnormal glucose: Secondary | ICD-10-CM

## 2017-09-12 LAB — COMPREHENSIVE METABOLIC PANEL
ALT: 64 U/L — AB (ref 0–53)
AST: 58 U/L — AB (ref 0–37)
Albumin: 4.3 g/dL (ref 3.5–5.2)
Alkaline Phosphatase: 60 U/L (ref 39–117)
BILIRUBIN TOTAL: 0.5 mg/dL (ref 0.2–1.2)
BUN: 11 mg/dL (ref 6–23)
CO2: 30 meq/L (ref 19–32)
Calcium: 9.5 mg/dL (ref 8.4–10.5)
Chloride: 99 mEq/L (ref 96–112)
Creatinine, Ser: 0.97 mg/dL (ref 0.40–1.50)
GFR: 90.21 mL/min (ref >60.00)
GLUCOSE: 80 mg/dL (ref 70–99)
Potassium: 4.6 mEq/L (ref 3.5–5.1)
SODIUM: 135 meq/L (ref 135–145)
Total Protein: 7.3 g/dL (ref 6.0–8.3)

## 2017-09-12 LAB — LIPID PANEL
CHOL/HDL RATIO: 9
CHOLESTEROL: 196 mg/dL (ref 0–200)
HDL: 21.2 mg/dL — AB (ref >39.00)
LDL Cholesterol: 157 mg/dL — ABNORMAL HIGH (ref 0–99)
NonHDL: 175.14
Triglycerides: 91 mg/dL (ref 0.0–149.0)
VLDL: 18.2 mg/dL (ref 0.0–40.0)

## 2017-09-12 LAB — PSA: PSA: 0.46 ng/mL (ref 0.10–4.00)

## 2017-09-12 LAB — HEMOGLOBIN A1C: HEMOGLOBIN A1C: 4.9 % (ref 4.6–6.5)

## 2017-09-13 ENCOUNTER — Telehealth: Payer: Self-pay

## 2017-09-13 ENCOUNTER — Other Ambulatory Visit: Payer: Self-pay

## 2017-09-13 DIAGNOSIS — R7989 Other specified abnormal findings of blood chemistry: Secondary | ICD-10-CM

## 2017-09-13 DIAGNOSIS — R945 Abnormal results of liver function studies: Principal | ICD-10-CM

## 2017-09-13 NOTE — Telephone Encounter (Signed)
Voicemail left and mychart message sent to patient with lab results and instructions to contact office for lab appointment for 2-3 weeks, lab order has been placed.

## 2017-09-13 NOTE — Telephone Encounter (Signed)
-----   Message from Leone Haven, MD sent at 09/12/2017 12:41 PM EDT ----- Please let the patient know that his liver function tests are mildly elevated.  I would like to recheck these 2-3 weeks.  Please place an order for a hepatic function panel for elevated LFTs.  His LDL cholesterol remains elevated though is not at a point where we would place him on medication.  He needs to work on diet and exercise as he is able to.  His other lab work is acceptable.  Thanks.

## 2017-11-22 ENCOUNTER — Ambulatory Visit: Payer: 59 | Admitting: Family Medicine

## 2018-03-21 IMAGING — MR MR CERVICAL SPINE W/O CM
5 series · 35 of 48 positions shown · non-contrast
Comparison: Cervical spine radiographs 01/06/2016

CLINICAL DATA: Cervical radiculopathy. Right neck pain with
numbness and tingling in the right arm. Symptoms began 8 months ago,
resolved, and reappeared 2 weeks ago.

EXAM:
MRI CERVICAL SPINE WITHOUT CONTRAST
TECHNIQUE: Multiplanar, multisequence MR imaging of the cervical spine was
performed. No intravenous contrast was administered.

[Series 3: T2 · sagittal · 3.0mm · 0.78mm/px · 7 of 15 slices shown (1 of 2)]
[im 1/15]
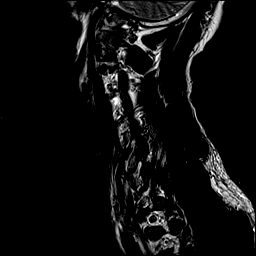
[im 3/15]
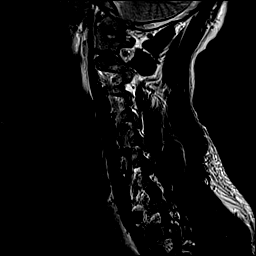
[im 5/15]
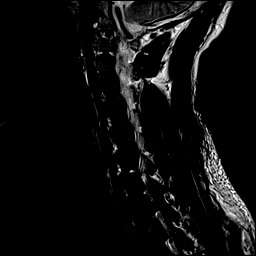
[im 8/15]
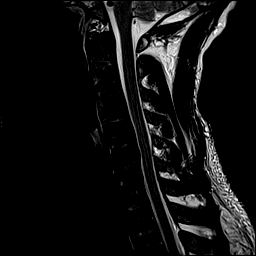
[im 10/15]
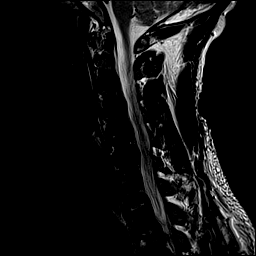
[im 12/15]
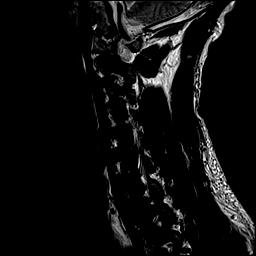
[im 15/15]
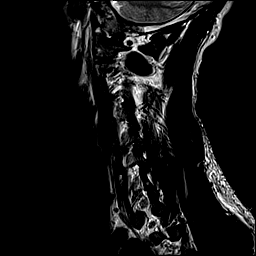

[Series 4: T1 · sagittal · 3.0mm · 0.70mm/px · 7 of 15 slices shown]
[im 1/15]
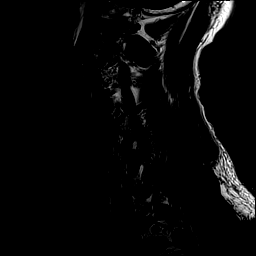
[im 3/15]
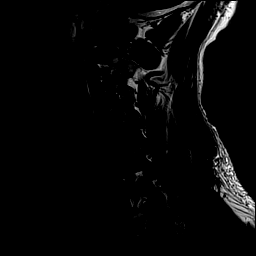
[im 5/15]
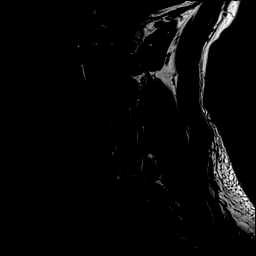
[im 8/15]
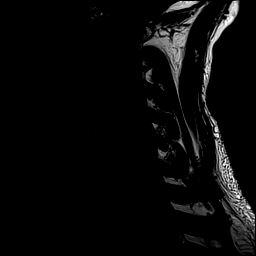
[im 10/15]
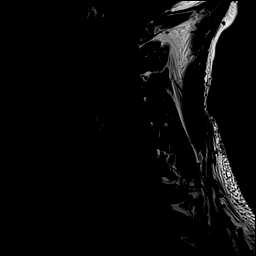
[im 12/15]
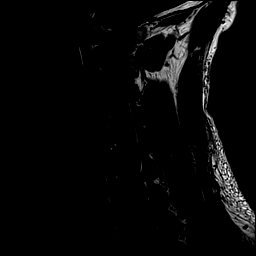
[im 15/15]
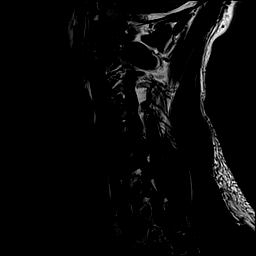

[Series 5: STIR · sagittal · 3.0mm · 0.35mm/px · 7 of 15 slices shown]
[im 1/15]
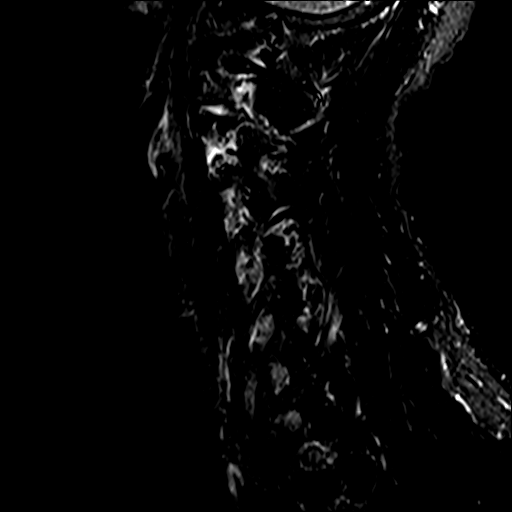
[im 3/15]
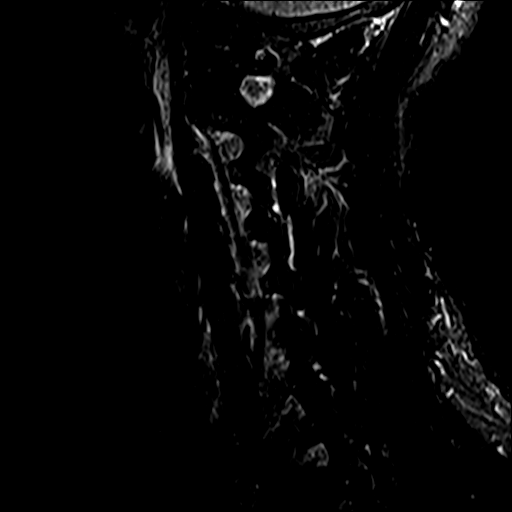
[im 5/15]
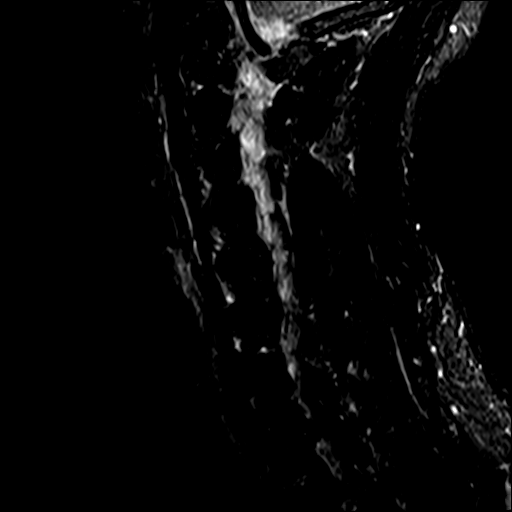
[im 8/15]
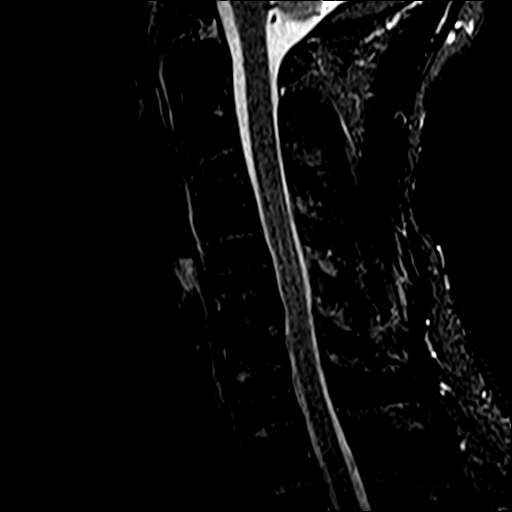
[im 10/15]
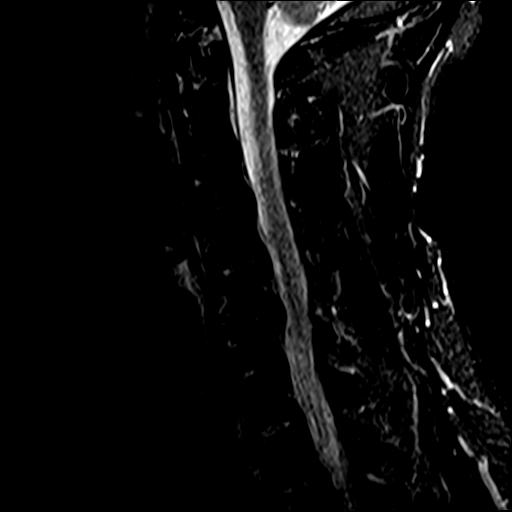
[im 12/15]
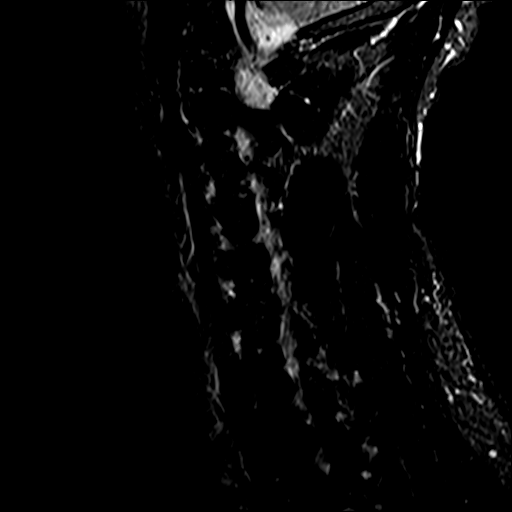
[im 15/15]
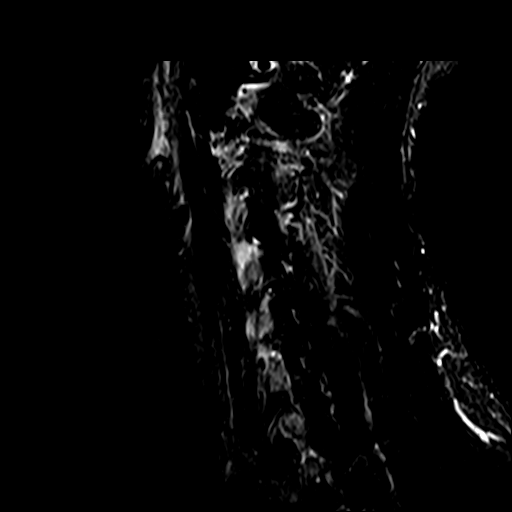

[Series 6: T2 · axial · 3.0mm · 0.70mm/px · z∈[-95,+22]mm · 9 of 30 slices shown (2 of 2)]
[im 1/30]
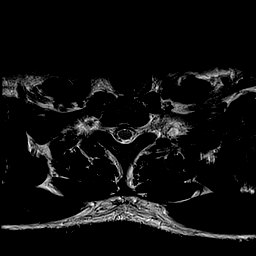
[im 5/30]
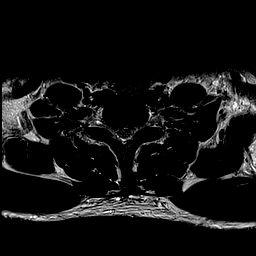
[im 10/30]
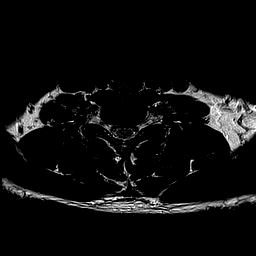
[im 13/30]
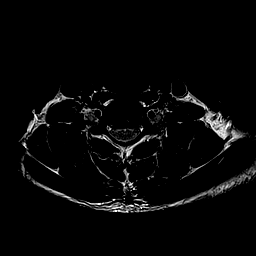
[im 15/30]
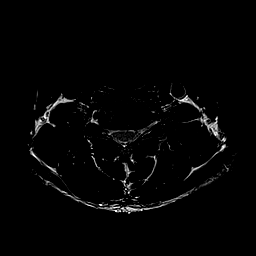
[im 17/30]
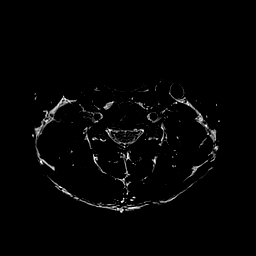
[im 20/30]
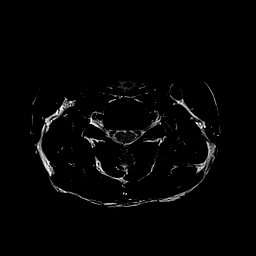
[im 25/30]
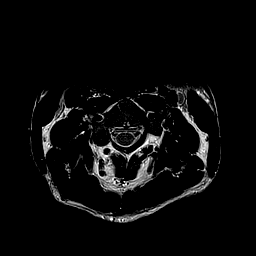
[im 30/30]
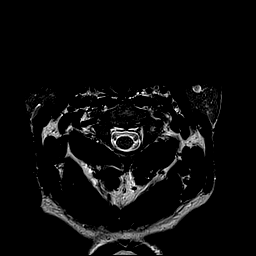

[Series 7: mpgr ax · axial · 3.0mm · 0.35mm/px · z∈[-95,-35]mm · 5 of 32 slices shown]
[im 1/32]
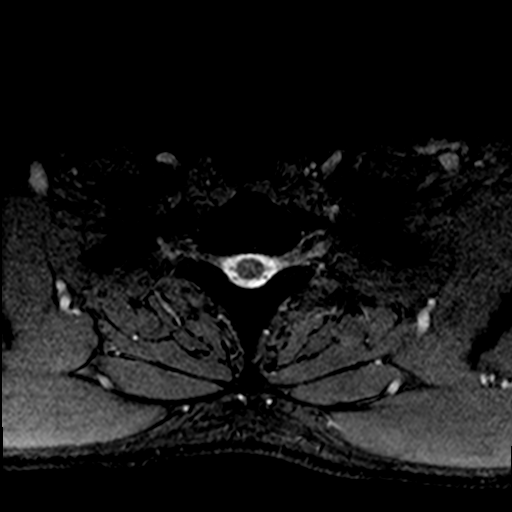
[im 5/32]
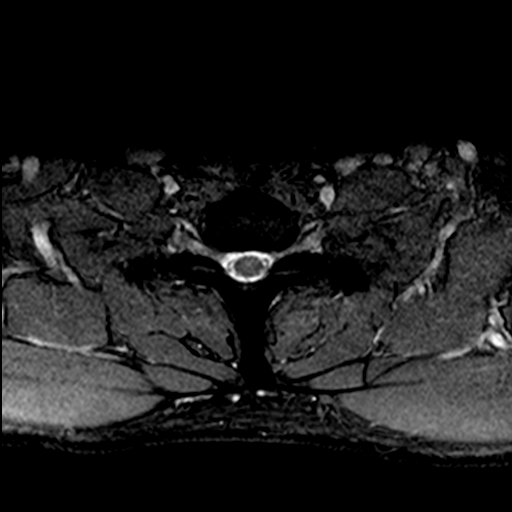
[im 10/32]
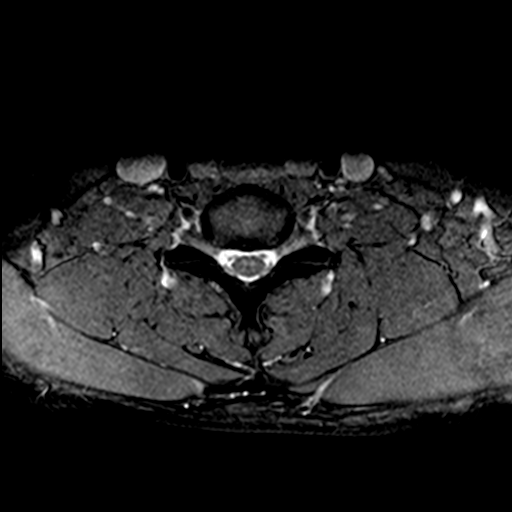
[im 15/32]
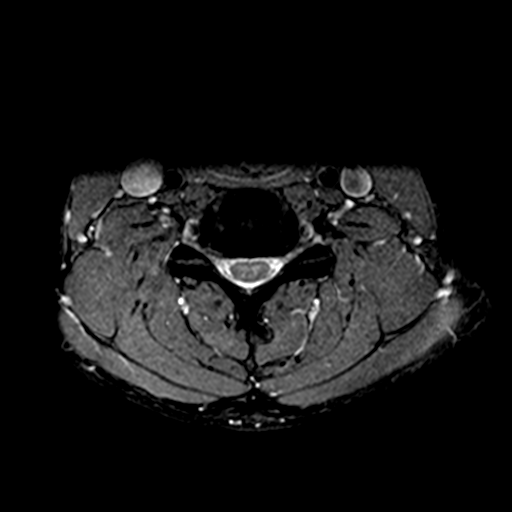
[im 17/32]
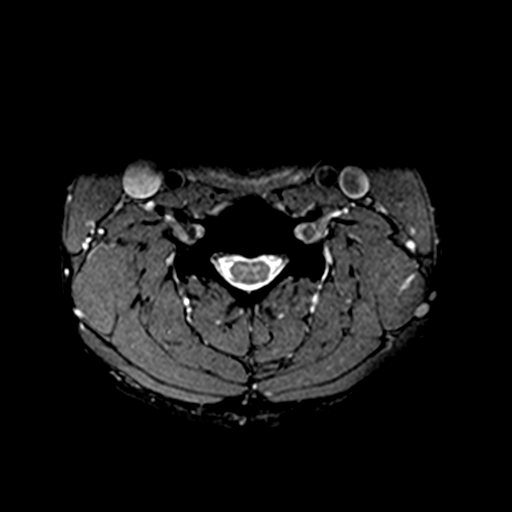

[35 of 48 positions shown; findings below may reference images not displayed]

FINDINGS: Alignment: Cervical spine straightening. Trace retrolisthesis of C5
on C6.

Vertebrae: No fracture, osseous lesion, or significant marrow edema.

Cord: Normal signal and morphology.

Posterior Fossa, vertebral arteries, paraspinal tissues:
Unremarkable.

Disc levels:

C2-3:  Negative.

C3-4: Uncovertebral spurring results in mild bilateral neural
foraminal stenosis. No spinal stenosis.

C4-5: Uncovertebral spurring results in mild right and
mild-to-moderate left neural foraminal stenosis. Diminutive central
disc protrusion without spinal stenosis pre

C5-6: Mild disc space narrowing. Broad-based posterior disc
osteophyte complex results in moderate right and mild left neural
foraminal stenosis with potential right C6 nerve root impingement.
Borderline spinal stenosis.

C6-7:  Negative.

C7-T1: Mild left neural foraminal stenosis due to uncovertebral
spurring. No spinal stenosis.
IMPRESSION: Cervical disc degeneration predominantly at C5-6 where there is
moderate right neural foraminal stenosis. No significant spinal
stenosis.

## 2018-08-10 ENCOUNTER — Ambulatory Visit
Admission: EM | Admit: 2018-08-10 | Discharge: 2018-08-10 | Disposition: A | Discharge status: Home / Self Care | Payer: 59 | Attending: Family Medicine | Admitting: Family Medicine

## 2018-08-10 ENCOUNTER — Other Ambulatory Visit: Payer: Self-pay

## 2018-08-10 ENCOUNTER — Encounter: Payer: Self-pay | Admitting: Emergency Medicine

## 2018-08-10 DIAGNOSIS — R05 Cough: Secondary | ICD-10-CM | POA: Diagnosis not present

## 2018-08-10 DIAGNOSIS — M791 Myalgia, unspecified site: Secondary | ICD-10-CM

## 2018-08-10 DIAGNOSIS — R509 Fever, unspecified: Secondary | ICD-10-CM | POA: Diagnosis not present

## 2018-08-10 DIAGNOSIS — R69 Illness, unspecified: Secondary | ICD-10-CM | POA: Diagnosis not present

## 2018-08-10 DIAGNOSIS — J111 Influenza due to unidentified influenza virus with other respiratory manifestations: Secondary | ICD-10-CM

## 2018-08-10 LAB — RAPID INFLUENZA A&B ANTIGENS (ARMC ONLY): INFLUENZA B (ARMC): NEGATIVE

## 2018-08-10 LAB — RAPID INFLUENZA A&B ANTIGENS: Influenza A (ARMC): NEGATIVE

## 2018-08-10 MED ORDER — BALOXAVIR MARBOXIL(80 MG DOSE) 2 X 40 MG PO TBPK: 80.0000 mg | ORAL_TABLET | Freq: Once | ORAL | 0 refills | Status: AC

## 2018-08-10 NOTE — Discharge Instructions (Signed)
Rest.   Fluids.  Take care  Dr. Lacinda Axon

## 2018-08-10 NOTE — ED Triage Notes (Signed)
Patient c/o runny nose, cough, bodyaches that started this morning.  Patient has not checked his temperature.

## 2018-08-10 NOTE — ED Provider Notes (Signed)
[facility]    CSN: [id number] Arrival date & time: 08/10/18  1434   History   Chief Complaint Chief Complaint  Patient presents with  . Cough  . Generalized Body Aches   HPI  43 year old male presents with fever, cough, generalized body aches.  Did not feel well last night.  Had a temperature of 99.5.  Developed body aches and cough today.  Has feels like his body aches are worsening.  Symptoms are severe.  Feels febrile.  Does not have a temperature currently.  No known exacerbating or relieving factors.  No reported sick contacts.  No recent travel. No other associated symptoms. No other complaints.  PMH, Surgical Hx, Family Hx, Social History reviewed and updated as below.  PMH: Patient Active Problem List   Diagnosis Date Noted  . Borderline blood pressure 08/18/2017  . Prostate cancer screening 08/18/2017  . Pituitary tumor 02/14/2017  . Cervical radiculopathy 02/14/2017   Past Surgical History:  Procedure Laterality Date  . APPENDECTOMY  2003  . HAND SURGERY Right 2000   Home Medications    Prior to Admission medications   Medication Sig Start Date End Date Taking? Authorizing Provider  CABERGOLINE PO Take by mouth.   Yes [provider]  Baloxavir Marboxil,80 MG Dose, (XOFLUZA) 2 x 40 MG TBPK Take 80 mg by mouth once for 1 dose. 08/10/18 08/10/18  Coral Spikes, DO    Family History Family History  Problem Relation Age of Onset  . Diabetes Mother   . Hyperlipidemia Mother   . Diabetes Maternal Grandmother   . Hypertension Maternal Grandfather     Social History Social History   Tobacco Use  . Smoking status: Never Smoker  . Smokeless tobacco: Never Used  Substance Use Topics  . Alcohol use: Yes  . Drug use: No     Allergies   Patient has no known allergies.   Review of Systems Review of Systems  Constitutional: Positive for fever.  Respiratory: Positive for cough.   Musculoskeletal:       Body aches.   Physical Exam  Triage Vital Signs ED Triage Vitals  Enc Vitals Group     BP 08/10/18 1507 (!) 149/84     Pulse Rate 08/10/18 1507 99     Resp 08/10/18 1507 16     Temp 08/10/18 1507 98.6 F (37 C)     Temp Source 08/10/18 1507 Oral     SpO2 08/10/18 1507 99 %     Weight 08/10/18 1505 220 lb (99.8 kg)     Height 08/10/18 1505 6' (1.829 m)     Head Circumference --      Peak Flow --      Pain Score 08/10/18 1505 3     Pain Loc --      Pain Edu? --      Excl. in [place]? --    Updated Vital Signs BP (!) 149/84 (BP Location: Left Arm)   Pulse 99   Temp 98.6 F (37 C) (Oral)   Resp 16   Ht 6' (1.829 m)   Wt 99.8 kg   SpO2 99%   BMI 29.84 kg/m   Visual Acuity Right Eye Distance:   Left Eye Distance:   Bilateral Distance:    Right Eye Near:   Left Eye Near:    Bilateral Near:     Physical Exam Vitals signs and nursing note reviewed.  Constitutional:      General: He is not in  acute distress.    Appearance: Normal appearance.  HENT:     Head: Normocephalic and atraumatic.     Mouth/Throat:     Pharynx: Oropharynx is clear. No oropharyngeal exudate.  Eyes:     General:        Right eye: No discharge.        Left eye: No discharge.     Conjunctiva/sclera: Conjunctivae normal.  Cardiovascular:     Rate and Rhythm: Normal rate and regular rhythm.  Pulmonary:     Effort: Pulmonary effort is normal.     Breath sounds: Normal breath sounds.  Neurological:     Mental Status: He is alert.  Psychiatric:        Mood and Affect: Mood normal.        Behavior: Behavior normal.    UC Treatments / Results  Labs (all labs ordered are listed, but only abnormal results are displayed) Labs Reviewed  RAPID INFLUENZA A&B ANTIGENS ([place])    EKG None  Radiology No results found.  Procedures Procedures (including critical care time)  Medications Ordered in UC Medications - No data to display  Initial Impression / Assessment and Plan / UC Course  I have reviewed the triage vital  signs and the nursing notes.  Pertinent labs & imaging results that were available during my care of the patient were reviewed by me and considered in my medical decision making (see chart for details).    43 year old male presents with suspected influenza.  Patient and wife concerned about COVID-19.  I doubt that he has this based off his lack of travel.  It is possible as we have cases in [place].  We elected to not proceed with testing at this time.  Placing on [place].  Supportive care.  Final Clinical Impressions(s) / UC Diagnoses   Final diagnoses:  Influenza-like illness     Discharge Instructions     Rest.   Fluids.  Take care  Dr. Lacinda Axon    ED Prescriptions    Medication Sig Dispense Auth. Provider   Baloxavir Marboxil,80 MG Dose, (XOFLUZA) 2 x 40 MG TBPK Take 80 mg by mouth once for 1 dose. 2 each Coral Spikes, DO     Controlled Substance Prescriptions [place] Controlled Substance Registry consulted? Not Applicable   Coral Spikes, DO 08/10/18 1636

## 2019-09-11 DIAGNOSIS — E221 Hyperprolactinemia: Secondary | ICD-10-CM | POA: Diagnosis not present

## 2019-09-11 DIAGNOSIS — Z6829 Body mass index (BMI) 29.0-29.9, adult: Secondary | ICD-10-CM | POA: Diagnosis not present

## 2019-11-11 ENCOUNTER — Ambulatory Visit
Admission: EM | Admit: 2019-11-11 | Discharge: 2019-11-11 | Disposition: A | Discharge status: Home / Self Care | Payer: BC Managed Care – PPO | Attending: Family Medicine | Admitting: Family Medicine

## 2019-11-11 ENCOUNTER — Other Ambulatory Visit: Payer: Self-pay

## 2019-11-11 DIAGNOSIS — B029 Zoster without complications: Secondary | ICD-10-CM

## 2019-11-11 MED ORDER — HYDROCODONE-ACETAMINOPHEN 5-325 MG PO TABS: ORAL_TABLET | ORAL | 0 refills | Status: DC

## 2019-11-11 MED ORDER — VALACYCLOVIR HCL 1 G PO TABS: 1000.0000 mg | ORAL_TABLET | Freq: Three times a day (TID) | ORAL | 0 refills | Status: DC

## 2019-11-11 NOTE — ED Provider Notes (Signed)
[facility]    CSN: [id number] Arrival date & time: 11/11/19  1740      History   Chief Complaint Chief Complaint  Patient presents with  . Rash    HPI Jonathan Day is a 44 y.o. male.   44 yo male with a c/o rash on his right chest and back that feels like "stinging", "burning" and has been worsening since 4 days ago (Friday). States it feels worse in the heat. Denies any fevers, chills, drainage.    Rash   Past Medical History:  Diagnosis Date  . Chicken pox     Patient Active Problem List   Diagnosis Date Noted  . Borderline blood pressure 08/18/2017  . Prostate cancer screening 08/18/2017  . Pituitary tumor 02/14/2017  . Cervical radiculopathy 02/14/2017    Past Surgical History:  Procedure Laterality Date  . APPENDECTOMY  2003  . HAND SURGERY Right 2000       Home Medications    Prior to Admission medications   Medication Sig Start Date End Date Taking? Authorizing Provider  CABERGOLINE PO Take by mouth.    [provider]  HYDROcodone-acetaminophen (NORCO/VICODIN) 5-325 MG tablet 1-2 tabs po qd prn 11/11/19   Norval Gable, MD  valACYclovir (VALTREX) 1000 MG tablet Take 1 tablet (1,000 mg total) by mouth 3 (three) times daily. 11/11/19   Norval Gable, MD    Family History Family History  Problem Relation Age of Onset  . Diabetes Mother   . Hyperlipidemia Mother   . Diabetes Maternal Grandmother   . Hypertension Maternal Grandfather     Social History Social History   Tobacco Use  . Smoking status: Never Smoker  . Smokeless tobacco: Never Used  Vaping Use  . Vaping Use: Never used  Substance Use Topics  . Alcohol use: Yes  . Drug use: No     Allergies   Patient has no known allergies.   Review of Systems Review of Systems  Skin: Positive for rash.     Physical Exam Triage Vital Signs ED Triage Vitals  Enc Vitals Group     BP 11/11/19 1803 (!) 150/80     Pulse Rate 11/11/19 1803 87     Resp  11/11/19 1803 18     Temp 11/11/19 1803 98.5 F (36.9 C)     Temp Source 11/11/19 1803 Oral     SpO2 11/11/19 1803 98 %     Weight 11/11/19 1802 220 lb (99.8 kg)     Height 11/11/19 1802 6' (1.829 m)     Head Circumference --      Peak Flow --      Pain Score 11/11/19 1801 6     Pain Loc --      Pain Edu? --      Excl. in [place]? --    No data found.  Updated Vital Signs BP (!) 150/80 (BP Location: Left Arm)   Pulse 87   Temp 98.5 F (36.9 C) (Oral)   Resp 18   Ht 6' (1.829 m)   Wt 99.8 kg   SpO2 98%   BMI 29.84 kg/m   Visual Acuity Right Eye Distance:   Left Eye Distance:   Bilateral Distance:    Right Eye Near:   Left Eye Near:    Bilateral Near:     Physical Exam Vitals and nursing note reviewed.  Constitutional:      General: He is not in acute distress.    Appearance:  Normal appearance. He is not toxic-appearing or diaphoretic.  Skin:    Findings: Erythema and rash present. Rash is vesicular.       Neurological:     Mental Status: He is alert.      UC Treatments / Results  Labs (all labs ordered are listed, but only abnormal results are displayed) Labs Reviewed - No data to display  EKG   Radiology No results found.  Procedures Procedures (including critical care time)  Medications Ordered in UC Medications - No data to display  Initial Impression / Assessment and Plan / UC Course  I have reviewed the triage vital signs and the nursing notes.  Pertinent labs & imaging results that were available during my care of the patient were reviewed by me and considered in my medical decision making (see chart for details).      Final Clinical Impressions(s) / UC Diagnoses   Final diagnoses:  Herpes zoster without complication    ED Prescriptions    Medication Sig Dispense Auth. Provider   valACYclovir (VALTREX) 1000 MG tablet Take 1 tablet (1,000 mg total) by mouth 3 (three) times daily. 21 tablet Norval Gable, MD    HYDROcodone-acetaminophen (NORCO/VICODIN) 5-325 MG tablet 1-2 tabs po qd prn 6 tablet Norval Gable, MD     1. diagnosis reviewed with patient 2. rx as per orders above; reviewed possible side effects, interactions, risks and benefits  3. Recommend supportive treatment with otc analgesics 4. Follow-up prn if symptoms worsen or don't improve  I have reviewed the PDMP during this encounter.   Norval Gable, MD 11/11/19 [phone]

## 2019-11-11 NOTE — ED Triage Notes (Signed)
Patient complains of rash that started on right arm, chest and back that started Friday. States that rash is worse in the heat.

## 2019-11-12 ENCOUNTER — Telehealth: Payer: BC Managed Care – PPO | Admitting: Emergency Medicine

## 2019-11-12 DIAGNOSIS — B029 Zoster without complications: Secondary | ICD-10-CM | POA: Diagnosis not present

## 2019-11-12 MED ORDER — ACYCLOVIR 5 % EX OINT: TOPICAL_OINTMENT | CUTANEOUS | 0 refills | Status: DC

## 2019-11-12 NOTE — Progress Notes (Signed)
E-visit for Shingles   We are sorry that you are not feeling well. Here is how we plan to help!  Based on what you shared with me it looks like you have shingles.  Shingles or herpes zoster, is a common infection of the nerves.  It is a painful rash caused by the herpes zoster virus.  This is the same virus that causes chickenpox.  After a person has chickenpox, the virus remains inactive in the nerve cells.  Years later, the virus can become active again and travel to the skin.  It typically will appear on one side of the face or body.  Burning or shooting pain, tingling, or itching are early signs of the infection.  Blisters typically scab over in 7 to 10 days and clear up within 2-4 weeks. Shingles is only contagious to people that have never had the chickenpox, the chickenpox vaccine, or anyone who has a compromised immune system.  You should avoid contact with these type of people until your blisters scab over.  I've sent a prescription for topical acyclovir.  I am unaware of any trials that have shown this to be any more beneficial than oral Valtrex, but if you'd like to try it, I don't think there are any adverse effects.  Keep in mind, these medicines generally only work well if started within ~48 hours of the symptoms occurring.  You can also try lidocaine patches available over the counter.   HOME CARE: . Apply ice packs (wrapped in a thin towel), cool compresses, or soak in cool bath to help reduce pain. . Use calamine lotion to calm itchy skin. . Avoid scratching the rash. . Avoid direct sunlight.  GET HELP RIGHT AWAY IF: . Symptoms that don't away after treatment. . A rash or blisters near your eye. . Increased drainage, fever, or rash after treatment. . Severe pain that doesn't go away.   MAKE SURE YOU    Understand these instructions.  Will watch your condition.  Will get help right away if you are not doing well or get worse.  Thank you for choosing an  e-visit. Your e-visit answers were reviewed by a board certified advanced clinical practitioner to complete your personal care plan. Depending upon the condition, your plan could have included both over the counter or prescription medications.  Please review your pharmacy choice. Make sure the pharmacy is open so you can pick up prescription now. If there is a problem, you may contact your provider through [employer] and have the prescription routed to another pharmacy.  Your safety is important to [place]. If you have drug allergies check your prescription carefully.   For the next 24 hours you can use MyChart to ask questions about today's visit, request a non-urgent call back, or ask for a work or school excuse.  You will get an email in the next two days asking about your experience. I hope that your e-visit has been valuable and will speed your recovery  Greater than 5 minutes, yet less than 10 minutes was used in reviewing the patient's chart, questionnaire, prescribing medications, and documentation for this visit.

## 2019-11-15 MED ORDER — ACYCLOVIR 5 % EX OINT: TOPICAL_OINTMENT | CUTANEOUS | 0 refills | Status: DC

## 2019-11-15 NOTE — Addendum Note (Signed)
Addended by: Montine Circle B on: 11/15/2019 10:08 PM   Modules accepted: Orders

## 2020-04-15 DIAGNOSIS — D1739 Benign lipomatous neoplasm of skin and subcutaneous tissue of other sites: Secondary | ICD-10-CM | POA: Diagnosis not present

## 2020-04-15 DIAGNOSIS — L811 Chloasma: Secondary | ICD-10-CM | POA: Diagnosis not present

## 2020-04-15 DIAGNOSIS — D171 Benign lipomatous neoplasm of skin and subcutaneous tissue of trunk: Secondary | ICD-10-CM | POA: Diagnosis not present

## 2020-04-15 DIAGNOSIS — L810 Postinflammatory hyperpigmentation: Secondary | ICD-10-CM | POA: Diagnosis not present

## 2020-06-09 DIAGNOSIS — L810 Postinflammatory hyperpigmentation: Secondary | ICD-10-CM | POA: Diagnosis not present

## 2020-06-09 DIAGNOSIS — L811 Chloasma: Secondary | ICD-10-CM | POA: Diagnosis not present

## 2020-08-03 ENCOUNTER — Telehealth: Payer: Self-pay | Admitting: Family Medicine

## 2020-08-03 ENCOUNTER — Other Ambulatory Visit: Payer: Self-pay

## 2020-08-03 ENCOUNTER — Emergency Department
Admission: EM | Admit: 2020-08-03 | Discharge: 2020-08-03 | Disposition: A | Discharge status: Home / Self Care | Payer: BC Managed Care – PPO | Attending: Emergency Medicine | Admitting: Emergency Medicine

## 2020-08-03 ENCOUNTER — Ambulatory Visit: Payer: BC Managed Care – PPO | Admitting: Family Medicine

## 2020-08-03 ENCOUNTER — Other Ambulatory Visit
Admission: RE | Admit: 2020-08-03 | Discharge: 2020-08-03 | Disposition: A | Discharge status: Home / Self Care | Payer: BC Managed Care – PPO | Source: Ambulatory Visit | Attending: Family Medicine | Admitting: Family Medicine

## 2020-08-03 ENCOUNTER — Encounter: Payer: Self-pay | Admitting: Family Medicine

## 2020-08-03 VITALS — BP 140/70 | HR 89 | Temp 97.9°F | Ht 72.0 in | Wt 215.0 lb

## 2020-08-03 DIAGNOSIS — H538 Other visual disturbances: Secondary | ICD-10-CM

## 2020-08-03 DIAGNOSIS — R0602 Shortness of breath: Secondary | ICD-10-CM | POA: Insufficient documentation

## 2020-08-03 DIAGNOSIS — R42 Dizziness and giddiness: Secondary | ICD-10-CM | POA: Diagnosis not present

## 2020-08-03 DIAGNOSIS — F419 Anxiety disorder, unspecified: Secondary | ICD-10-CM | POA: Diagnosis not present

## 2020-08-03 HISTORY — DX: Neoplasm of unspecified behavior of endocrine glands and other parts of nervous system: D49.7

## 2020-08-03 LAB — CBC WITH DIFFERENTIAL/PLATELET
Abs Immature Granulocytes: 0.02 10*3/uL (ref 0.00–0.07)
Basophils Absolute: 0.1 10*3/uL (ref 0.0–0.1)
Basophils Relative: 1 %
Eosinophils Absolute: 0.5 10*3/uL (ref 0.0–0.5)
Eosinophils Relative: 10 %
HCT: 45 % (ref 39.0–52.0)
Hemoglobin: 16.3 g/dL (ref 13.0–17.0)
Immature Granulocytes: 0 %
Lymphocytes Relative: 20 %
Lymphs Abs: 1.1 10*3/uL (ref 0.7–4.0)
MCH: 32.1 pg (ref 26.0–34.0)
MCHC: 36.2 g/dL — ABNORMAL HIGH (ref 30.0–36.0)
MCV: 88.8 fL (ref 80.0–100.0)
Monocytes Absolute: 0.5 10*3/uL (ref 0.1–1.0)
Monocytes Relative: 8 %
Neutro Abs: 3.3 10*3/uL (ref 1.7–7.7)
Neutrophils Relative %: 61 %
Platelets: 302 10*3/uL (ref 150–400)
RBC: 5.07 MIL/uL (ref 4.22–5.81)
RDW: 11.9 % (ref 11.5–15.5)
WBC: 5.5 10*3/uL (ref 4.0–10.5)
nRBC: 0 % (ref 0.0–0.2)

## 2020-08-03 LAB — URINALYSIS, ROUTINE W REFLEX MICROSCOPIC
Bilirubin Urine: NEGATIVE
Glucose, UA: NEGATIVE mg/dL
Hgb urine dipstick: NEGATIVE
Ketones, ur: NEGATIVE mg/dL
Leukocytes,Ua: NEGATIVE
Nitrite: NEGATIVE
Protein, ur: NEGATIVE mg/dL
Specific Gravity, Urine: 1.002 — ABNORMAL LOW (ref 1.005–1.030)
pH: 7 (ref 5.0–8.0)

## 2020-08-03 LAB — BASIC METABOLIC PANEL
Anion gap: 9 (ref 5–15)
BUN: 12 mg/dL (ref 6–20)
CO2: 26 mmol/L (ref 22–32)
Calcium: 9 mg/dL (ref 8.9–10.3)
Chloride: 103 mmol/L (ref 98–111)
Creatinine, Ser: 1.16 mg/dL (ref 0.61–1.24)
GFR, Estimated: >60 mL/min (ref >60)
Glucose, Bld: 110 mg/dL — ABNORMAL HIGH (ref 70–99)
Potassium: 4 mmol/L (ref 3.5–5.1)
Sodium: 138 mmol/L (ref 135–145)

## 2020-08-03 LAB — TSH: TSH: 2.779 u[IU]/mL (ref 0.350–4.500)

## 2020-08-03 LAB — TROPONIN I (HIGH SENSITIVITY)
Troponin I (High Sensitivity): 6 ng/L (ref <18)
Troponin I (High Sensitivity): 6 ng/L (ref <18)

## 2020-08-03 LAB — D-DIMER, QUANTITATIVE: D-Dimer, Quant: <0.27 ug/mL-FEU (ref 0.00–0.50)

## 2020-08-03 LAB — MAGNESIUM: Magnesium: 2.2 mg/dL (ref 1.7–2.4)

## 2020-08-03 MED ORDER — HYDROXYZINE HCL 25 MG PO TABS: 25.0000 mg | ORAL_TABLET | Freq: Three times a day (TID) | ORAL | 0 refills | Status: DC | PRN

## 2020-08-03 NOTE — ED Provider Notes (Signed)
Repeat troponin normal. Will discharge, discussed follow-up plan with his primary with both the patient and his wife as well as return precautions.  Patient in agreement   Delman Kitten, MD 08/03/20 [phone]

## 2020-08-03 NOTE — Telephone Encounter (Signed)
Patient's wife called and stated they just go home from the ED this morning. Patient complains of blurred vision and dizziness. ED did not find anything wrong. Patient's wife would like him seen asap at office. Dr. Caryl Bis has no appointments available at the time of call until Wednesday. Patient has been scheduled with Dr. Aundra Dubin for tomorrow (08/04/20). Patient's wife is asking Dr. Caryl Bis are there any other labs that need to be done that the hospital did not do.

## 2020-08-03 NOTE — ED Provider Notes (Signed)
Patient resting comfortably, asymptomatic.  Normal vital signs.  Very reassuring work-up to this point.  Repeat troponin is pending.  Discussed with the patient follow-up plan with Dr. Caryl Bis, also discussed return precautions including worsening dyspnea.  Patient and wife both in agreement.     Delman Kitten, MD 08/03/20 [phone]

## 2020-08-03 NOTE — Patient Instructions (Signed)
Nice to see you.  Please go get the additional labs at the hospital.  If your symptoms worsen please get looked at again.  Please try the hydroxyzine for the anxiety component.  Please be careful as this may make you drowsy.

## 2020-08-03 NOTE — Progress Notes (Signed)
[name], MD Phone: [phone]  Jonathan Day is a 45 y.o. male who presents today for same-day visit.  Anxiety/dizziness: Patient notes he woke up Saturday in the middle of the night and felt lightheaded and felt as though his vision was blurry.  He had to go outside and take deep breaths and cool down.  He did not feel great.  He felt panicky and anxious.  He went back to bed and was fine the next morning.  The morning of this visit he woke with similar symptoms.  Felt like a panic attack.  Denies chest pain, numbness, weakness, headache, loss of vision, and double vision.  No history of anxiety or panic attacks.  He has had 2 episodes with this though symptoms today have been more persistent.  His wife is a Marine scientist and noted his pulse was regular when this was occurring.  He went to the emergency department and had a negative work-up that was reassuring.  His wife notes they did not draw a D-dimer and she is interested in having that completed.  He denies leg swelling, recent travel, and recent surgeries.  He notes no changes at home or in his life that could have triggered this.  Social History   Tobacco Use  Smoking Status Never Smoker  Smokeless Tobacco Never Used    Current Outpatient Medications on File Prior to Visit  Medication Sig Dispense Refill  . CABERGOLINE PO Take by mouth.    Marland Kitchen acyclovir ointment (ZOVIRAX) 5 % Apply to rash every 3 hours for 7 days. (Patient not taking: Reported on 08/03/2020) 30 g 0  . HYDROcodone-acetaminophen (NORCO/VICODIN) 5-325 MG tablet 1-2 tabs po qd prn (Patient not taking: Reported on 08/03/2020) 6 tablet 0  . valACYclovir (VALTREX) 1000 MG tablet Take 1 tablet (1,000 mg total) by mouth 3 (three) times daily. (Patient not taking: Reported on 08/03/2020) 21 tablet 0   No current facility-administered medications on file prior to visit.     ROS see history of present illness  Objective  Physical Exam Vitals:   08/03/20 1607  BP: 140/70   Pulse: 89  Temp: 97.9 F (36.6 C)  SpO2: 99%    BP Readings from Last 3 Encounters:  08/03/20 140/70  08/03/20 125/63  11/11/19 (!) 150/80   Wt Readings from Last 3 Encounters:  08/03/20 215 lb (97.5 kg)  08/03/20 215 lb (97.5 kg)  11/11/19 220 lb (99.8 kg)    Physical Exam Constitutional:      General: He is not in acute distress.    Appearance: He is not diaphoretic.  HENT:     Head: Normocephalic and atraumatic.  Eyes:     Extraocular Movements: Extraocular movements intact.  Cardiovascular:     Rate and Rhythm: Normal rate and regular rhythm.     Heart sounds: Normal heart sounds.  Pulmonary:     Effort: Pulmonary effort is normal.     Breath sounds: Normal breath sounds.  Skin:    General: Skin is warm and dry.  Neurological:     Mental Status: He is alert.     Comments: CN 2-12 intact, 5/5 strength in bilateral biceps, triceps, grip, quads, hamstrings, plantar and dorsiflexion, sensation to light touch intact in bilateral UE and LE, normal gait      Assessment/Plan: Please see individual problem list.  Problem List Items Addressed This Visit    Anxiety    I suspect the patient's symptoms are related to anxiety.  He had a  negative work-up in the ED which helped rule out an underlying cardiac cause.  We sent him for a D-dimer which was also negative ruling out PE as a cause.  This potentially could be a short-term issue as it just started.  Discussed treatment options and I opted to treat acute anxiety with hydroxyzine.  Discussed that this could make him drowsy he needs to monitor that.  I will see how he does with the medication overnight and let us know how he does.  Given reasons to seek medical attention the emergency department.      Relevant Medications   hydrOXYzine (ATARAX/VISTARIL) 25 MG tablet    Other Visit Diagnoses    Shortness of breath    -  Primary   Relevant Orders   D-Dimer, Quantitative (Completed)      This visit occurred during the  SARS-CoV-2 public health emergency.  Safety protocols were in place, including screening questions prior to the visit, additional usage of staff PPE, and extensive cleaning of exam room while observing appropriate contact time as indicated for disinfecting solutions.    [name], MD [place]

## 2020-08-03 NOTE — Telephone Encounter (Signed)
I informed Jonathan Day to place the patient in the same day slot today.  [name],cma

## 2020-08-03 NOTE — ED Provider Notes (Signed)
[facility] Emergency Department Provider Note  ____________________________________________   Event Date/Time   First MD Initiated Contact with Patient 08/03/20 [phone]     (approximate)  I have reviewed the triage vital signs and the nursing notes.   HISTORY  Chief Complaint Dizziness and Blurred Vision    HPI Jonathan Day is a 45 y.o. male with history of pituitary adenoma with prolactinemia on cabergoline who presents to the emergency department with complaints of episodes of lightheadedness, blurry vision, shortness of breath intermittently for the past 3 days. He states that the first episode happened while sleeping early Friday morning. He states he woke up and felt lightheaded and felt that his vision was blurry. He states that this made him feel like he needed to go outside and take multiple deep breaths. He was not sure if he was having a panic attack as he has never had one before. He denies having any chest pain or chest discomfort. Denies numbness, tingling or weakness. No headaches. No vision loss or double vision. He states the blurry vision was in both eyes. No recent fevers, cough, vomiting or diarrhea. He has never had similar symptoms before.        Past Medical History:  Diagnosis Date  . Chicken pox   . Pituitary tumor     Patient Active Problem List   Diagnosis Date Noted  . Borderline blood pressure 08/18/2017  . Prostate cancer screening 08/18/2017  . Pituitary tumor 02/14/2017  . Cervical radiculopathy 02/14/2017    Past Surgical History:  Procedure Laterality Date  . APPENDECTOMY  2003  . HAND SURGERY Right 2000    Prior to Admission medications   Medication Sig Start Date End Date Taking? Authorizing Provider  acyclovir ointment (ZOVIRAX) 5 % Apply to rash every 3 hours for 7 days. 11/15/19   Montine Circle, PA-C  CABERGOLINE PO Take by mouth.    [provider]  HYDROcodone-acetaminophen (NORCO/VICODIN)  5-325 MG tablet 1-2 tabs po qd prn 11/11/19   Norval Gable, MD  valACYclovir (VALTREX) 1000 MG tablet Take 1 tablet (1,000 mg total) by mouth 3 (three) times daily. 11/11/19   Norval Gable, MD    Allergies Patient has no known allergies.  Family History  Problem Relation Age of Onset  . Diabetes Mother   . Hyperlipidemia Mother   . Diabetes Maternal Grandmother   . Hypertension Maternal Grandfather     Social History Social History   Tobacco Use  . Smoking status: Never Smoker  . Smokeless tobacco: Never Used  Vaping Use  . Vaping Use: Never used  Substance Use Topics  . Alcohol use: Yes  . Drug use: No    Review of Systems Constitutional: No fever. Eyes: No visual changes. ENT: No sore throat. Cardiovascular: Denies chest pain. Respiratory: + shortness of breath. Gastrointestinal: No nausea, vomiting, diarrhea. Genitourinary: Negative for dysuria. Musculoskeletal: Negative for back pain. Skin: Negative for rash. Neurological: Negative for focal weakness or numbness.  ____________________________________________   PHYSICAL EXAM:  VITAL SIGNS: ED Triage Vitals  Enc Vitals Group     BP 08/03/20 0617 135/75     Pulse Rate 08/03/20 0617 73     Resp 08/03/20 0617 20     Temp 08/03/20 0617 98.7 F (37.1 C)     Temp Source 08/03/20 0617 Oral     SpO2 08/03/20 0617 100 %     Weight 08/03/20 0613 215 lb (97.5 kg)     Height 08/03/20 9563  6' (1.829 m)     Head Circumference --      Peak Flow --      Pain Score 08/03/20 0613 0     Pain Loc --      Pain Edu? --      Excl. in [place]? --    CONSTITUTIONAL: Alert and oriented and responds appropriately to questions. Well-appearing; well-nourished HEAD: Normocephalic, atraumatic EYES: Conjunctivae clear, pupils appear equal reactive to light, extraocular movements intact, no drainage or tearing ENT: normal nose; moist mucous membranes NECK: Supple, normal ROM CARD: RRR; S1 and S2 appreciated; no murmurs, no clicks,  no rubs, no gallops RESP: Normal chest excursion without splinting or tachypnea; breath sounds clear and equal bilaterally; no wheezes, no rhonchi, no rales, no hypoxia or respiratory distress, speaking full sentences ABD/GI: Normal bowel sounds; non-distended; soft, non-tender, no rebound, no guarding, no peritoneal signs, no hepatosplenomegaly BACK: The back appears normal EXT: Normal ROM in all joints; no deformity noted, no edema; no cyanosis SKIN: Normal color for age and race; warm; no rash on exposed skin NEURO: Moves all extremities equally, strength 5/5 in all 4 extremities, cranial nerves II through XII intact, normal speech, normal sensation diffusely, no dysmetria to finger-to-nose testing bilaterally PSYCH: The patient's mood and manner are appropriate.  ____________________________________________   LABS (all labs ordered are listed, but only abnormal results are displayed)  Labs Reviewed  CBC WITH DIFFERENTIAL/PLATELET  BASIC METABOLIC PANEL  MAGNESIUM  TSH  URINALYSIS, ROUTINE W REFLEX MICROSCOPIC  CBG MONITORING, ED  TROPONIN I (HIGH SENSITIVITY)   ____________________________________________  EKG   EKG Interpretation  Date/Time:  Monday August 03 2020 06:21:21 EST Ventricular Rate:  75 PR Interval:  150 QRS Duration: 116 QT Interval:  350 QTC Calculation: 390 R Axis:   58 Text Interpretation: Normal sinus rhythm with sinus arrhythmia Minimal voltage criteria for LVH, may be normal variant ( Cornell product ) Nonspecific T wave abnormality Abnormal ECG No old tracing to compare Confirmed by Pryor Curia [phone]) on 08/03/2020 6:22:12 AM       ____________________________________________  RADIOLOGY Jessie Foot Tyffany Waldrop, personally viewed and evaluated these images (plain radiographs) as part of my medical decision making, as well as reviewing the written report by the radiologist.  ED MD interpretation:  none  Official radiology report(s): No results  found.  ____________________________________________   PROCEDURES  Procedure(s) performed (including Critical Care):  Procedures  ____________________________________________   INITIAL IMPRESSION / ASSESSMENT AND PLAN / ED COURSE  As part of my medical decision making, I reviewed the following data within the [place] History obtained from family, Nursing notes reviewed and incorporated, Labs reviewed, EKG interpreted NSR, Old chart reviewed and Notes from prior ED visits         Patient here with 2 episodes of lightheadedness, blurry vision and shortness of breath. States he felt like he may be having a panic attack. He states he feels better but still does not feel normal but cannot describe this any further. He is no longer having lightheadedness, blurry vision or shortness of breath. No chest pain or chest discomfort. He denies any infectious symptoms. On my exam, patient is neurologically intact.   Discussed with patient and wife that this may be a panic attack but will check labs to ensure no anemia, electrolyte derangement, thyroid dysfunction, ACS causing his symptoms. His EKG does show nonspecific T wave abnormality with no old for comparison. He is not having any chest pain or shortness of  breath at this time. He does have a history of a pituitary adenoma although he is not having any headaches, vision loss or diplopia. I do not feel that he needs emergent MRI of his brain at this time and patient and wife are comfortable with this plan. He feels like his vision is back to normal. Will check visual acuity here.  ED PROGRESS  Labs show normal hemoglobin.  No leukocytosis.  Urine shows no ketones or signs of infection.  Other labs pending.  Signed out to Dr. Jacqualine Code.  I reviewed all nursing notes and pertinent previous records as available.  I have reviewed and interpreted any EKGs, lab and urine results, imaging (as  available).  ____________________________________________   FINAL CLINICAL IMPRESSION(S) / ED DIAGNOSES  Final diagnoses:  Dizziness  Blurry vision, bilateral     ED Discharge Orders    None      *Please note:  Jonathan Day was evaluated in Emergency Department on 08/03/2020 for the symptoms described in the history of present illness. He was evaluated in the context of the global COVID-19 pandemic, which necessitated consideration that the patient might be at risk for infection with the SARS-CoV-2 virus that causes COVID-19. Institutional protocols and algorithms that pertain to the evaluation of patients at risk for COVID-19 are in a state of rapid change based on information released by regulatory bodies including the CDC and federal and state organizations. These policies and algorithms were followed during the patient's care in the ED.  Some ED evaluations and interventions may be delayed as a result of limited staffing during and the pandemic.*   Note:  This document was prepared using Dragon voice recognition software and may include unintentional dictation errors.   Zechariah Bissonnette, Delice Bison, DO 08/03/20 [phone]

## 2020-08-03 NOTE — ED Triage Notes (Addendum)
Ambulatory to triage c/o dizziness and blurred vision upon awakening. No extremity weakness. Speech clear. States same sx occurred Friday and pt thought he was dehydrated. States he has been hydrating well over the weekend. Pt appears anxious and reports anxiety at this time. Pt alert and oriented X4, cooperative, RR even and unlabored, color WNL. Pt in NAD. Wife with patient.

## 2020-08-04 ENCOUNTER — Ambulatory Visit: Payer: BC Managed Care – PPO | Admitting: Internal Medicine

## 2020-08-04 ENCOUNTER — Encounter: Payer: Self-pay | Admitting: Family Medicine

## 2020-08-06 DIAGNOSIS — F419 Anxiety disorder, unspecified: Secondary | ICD-10-CM | POA: Insufficient documentation

## 2020-08-06 NOTE — Assessment & Plan Note (Signed)
I suspect the patient's symptoms are related to anxiety.  He had a negative work-up in the ED which helped rule out an underlying cardiac cause.  We sent him for a D-dimer which was also negative ruling out PE as a cause.  This potentially could be a short-term issue as it just started.  Discussed treatment options and I opted to treat acute anxiety with hydroxyzine.  Discussed that this could make him drowsy he needs to monitor that.  I will see how he does with the medication overnight and let us know how he does.  Given reasons to seek medical attention the emergency department.

## 2020-08-11 DIAGNOSIS — Z86018 Personal history of other benign neoplasm: Secondary | ICD-10-CM | POA: Diagnosis not present

## 2020-08-11 DIAGNOSIS — E221 Hyperprolactinemia: Secondary | ICD-10-CM | POA: Diagnosis not present

## 2020-09-01 ENCOUNTER — Ambulatory Visit: Payer: BC Managed Care – PPO | Admitting: Family Medicine

## 2020-09-09 ENCOUNTER — Ambulatory Visit: Payer: BC Managed Care – PPO | Admitting: Family Medicine

## 2020-09-09 ENCOUNTER — Encounter: Payer: Self-pay | Admitting: Family Medicine

## 2020-09-09 ENCOUNTER — Other Ambulatory Visit: Payer: Self-pay

## 2020-09-09 DIAGNOSIS — F419 Anxiety disorder, unspecified: Secondary | ICD-10-CM | POA: Diagnosis not present

## 2020-09-09 MED ORDER — ESCITALOPRAM OXALATE 10 MG PO TABS: 10.0000 mg | ORAL_TABLET | Freq: Every day | ORAL | 3 refills | Status: DC

## 2020-09-09 NOTE — Patient Instructions (Signed)
Nice to see you. We will start you on Lexapro. If you develop any of the side effects we discussed please let me know.

## 2020-09-09 NOTE — Assessment & Plan Note (Signed)
Patient with continued issues with anxiety.  We will start him on Lexapro 10 mg once daily.  Discussed common risks of sleep issues, weight gain, and sexual dysfunction.  He will let us know if these occur.  He will also monitor for any other side effects.  Discussed that it may take a month or 2 for this to start to work.  I will see him back in 6 weeks.

## 2020-09-09 NOTE — Progress Notes (Signed)
  [name], MD Phone: [phone]  Jonathan Day is a 45 y.o. male who presents today for follow-up.  Anxiety: Patient notes this continues to be an issue.  He has not had any full-blown panic attacks though does seem to have anxiety at baseline now.  He has had trouble sleeping and wakes up multiple times at night.  He tries to keep things calm though some days are worse than others.  He has he had some blurry vision with the Atarax and notes that triggered some anxiety as he is quite worried about his vision given that he has had a pituitary tumor.  Social History   Tobacco Use  Smoking Status Never Smoker  Smokeless Tobacco Never Used    Current Outpatient Medications on File Prior to Visit  Medication Sig Dispense Refill  . CABERGOLINE PO Take by mouth.     No current facility-administered medications on file prior to visit.     ROS see history of present illness  Objective  Physical Exam Vitals:   09/09/20 1436  BP: 120/80  Pulse: 78  Temp: 98.1 F (36.7 C)  SpO2: 98%    BP Readings from Last 3 Encounters:  09/09/20 120/80  08/03/20 140/70  08/03/20 125/63   Wt Readings from Last 3 Encounters:  09/09/20 215 lb 3.2 oz (97.6 kg)  08/03/20 215 lb (97.5 kg)  08/03/20 215 lb (97.5 kg)    Physical Exam Constitutional:      General: He is not in acute distress. Pulmonary:     Effort: Pulmonary effort is normal.  Neurological:     Mental Status: He is alert.      Assessment/Plan: Please see individual problem list.  Problem List Items Addressed This Visit    Anxiety    Patient with continued issues with anxiety.  We will start him on Lexapro 10 mg once daily.  Discussed common risks of sleep issues, weight gain, and sexual dysfunction.  He will let us know if these occur.  He will also monitor for any other side effects.  Discussed that it may take a month or 2 for this to start to work.  I will see him back in 6 weeks.      Relevant  Medications   escitalopram (LEXAPRO) 10 MG tablet      This visit occurred during the SARS-CoV-2 public health emergency.  Safety protocols were in place, including screening questions prior to the visit, additional usage of staff PPE, and extensive cleaning of exam room while observing appropriate contact time as indicated for disinfecting solutions.    [name], MD [place]

## 2020-09-10 ENCOUNTER — Ambulatory Visit: Payer: BC Managed Care – PPO | Admitting: Internal Medicine

## 2020-09-11 ENCOUNTER — Encounter: Payer: Self-pay | Admitting: Family Medicine

## 2020-09-11 DIAGNOSIS — F419 Anxiety disorder, unspecified: Secondary | ICD-10-CM

## 2020-09-17 NOTE — Telephone Encounter (Signed)
Pt's wife called and states that pt has not been sleeping for the past week and unable to go to work. He is having panic attacks. She states that the pt needs something for breakthrough anxiety until the SSRI gets in his system. She says that he is fine with taking lexapro of zoloft but needs something to relax asap. She states that she is starting to get really concerned. She wants Dr Caryl Bis to talk to him on the phone asap. Please advise wife or pt

## 2020-09-18 MED ORDER — SERTRALINE HCL 50 MG PO TABS: 50.0000 mg | ORAL_TABLET | Freq: Every day | ORAL | 3 refills | Status: DC

## 2020-09-18 MED ORDER — CLONAZEPAM 0.5 MG PO TABS: 0.2500 mg | ORAL_TABLET | Freq: Two times a day (BID) | ORAL | 0 refills | Status: AC | PRN

## 2020-09-24 ENCOUNTER — Telehealth: Payer: Self-pay | Admitting: Family Medicine

## 2020-09-24 NOTE — Telephone Encounter (Signed)
Patient's wife called to let Dr Caryl Bis know that patient passed out last night. He did not fill right and went to kitchen to get an ice pack. He thought it was not anxiety. Patient did today he felt better this morning. He did not take his sertraline (ZOLOFT) 50 MG tablet, he is wondering if that may be the issue. Please call wife first, husband is at work and may not be able to answer his phone.

## 2020-09-24 NOTE — Telephone Encounter (Signed)
Can he come in on Monday to be seen for follow-up for this? If he can not come Monday I could work him in Friday at 4:15 to evaluate further. If he loses consciousness he needs to go to the ED. He should hold the zoloft until I see him.

## 2020-09-24 NOTE — Telephone Encounter (Signed)
Lvm for the patient's wife to call me back.  Demaria Deeney,cma

## 2020-09-24 NOTE — Telephone Encounter (Signed)
I called the patients wife and he is scheduled for 4:15 pm on Friday per provider.  Rondo Spittler,cma

## 2020-09-24 NOTE — Telephone Encounter (Signed)
Patient's wife called and stated in the middle of the night the patient got up and stated he was "overheated"  and he went downstairs to get ice.  She stated she heard him scream out and when she went down he was lying on the floor.  She stated he was not unconscious and she let him lie there and she went back upstairs and got her BP cuff and his BP was 120/70, pulse was normal as well.  When he went to stand he stated he was really dizzy. He did not take the Zoloft this morning in fear that it could be a problem but did go to work  and has been fine since.  She requested to check more hormone levels.   Jashawna Reever,cma

## 2020-09-25 ENCOUNTER — Other Ambulatory Visit: Payer: Self-pay

## 2020-09-25 ENCOUNTER — Encounter: Payer: Self-pay | Admitting: Family Medicine

## 2020-09-25 ENCOUNTER — Ambulatory Visit (INDEPENDENT_AMBULATORY_CARE_PROVIDER_SITE_OTHER): Payer: BC Managed Care – PPO | Admitting: Family Medicine

## 2020-09-25 VITALS — BP 110/70 | HR 73 | Temp 97.3°F | Ht 72.0 in | Wt 207.4 lb

## 2020-09-25 DIAGNOSIS — F419 Anxiety disorder, unspecified: Secondary | ICD-10-CM | POA: Diagnosis not present

## 2020-09-25 DIAGNOSIS — R42 Dizziness and giddiness: Secondary | ICD-10-CM | POA: Diagnosis not present

## 2020-09-25 DIAGNOSIS — D497 Neoplasm of unspecified behavior of endocrine glands and other parts of nervous system: Secondary | ICD-10-CM | POA: Diagnosis not present

## 2020-09-25 NOTE — Progress Notes (Signed)
[name], MD Phone: [phone]  Jonathan Day is a 45 y.o. male who presents today for same-day visit.  Anxiety: The patient notes he had been on Zoloft for about 5 days and had been feeling better with regards to anxiety and his panic episodes.  He had not had any anxiety in that timeframe.  He woke up in the middle of the night a couple of days prior to this visit and felt quite hot.  He went downstairs to try to get some ice to cool down and once he got downstairs he started to feel dizzy and fell to the ground.  He hit the back of his head.  He remembers falling.  He notes he did not lose consciousness.  He just did not feel well.  His wife who is a nurse came downstairs and checked his vitals and he reports his blood pressure and pulse were all normal.  He notes he was conversing with her normally after this occurred.  He has not had any fevers.  He has not had any muscle stiffness.  He has not had any issues since then though he has not taken any Zoloft.  His wife wondered if the cabergoline could be interacting with the Zoloft.  The patient notes no numbness, weakness, vision changes, chest pain, shortness of breath, palpitations, or anxiety.  He reports he had an EKG when he was in the emergency room for the evaluation of his initial episode of anxiety.  Social History   Tobacco Use  Smoking Status Never Smoker  Smokeless Tobacco Never Used    Current Outpatient Medications on File Prior to Visit  Medication Sig Dispense Refill  . cabergoline (DOSTINEX) 0.5 MG tablet Take 0.25 mg by mouth 2 (two) times a week.    . clonazePAM (KLONOPIN) 0.5 MG tablet Take 0.5 tablets (0.25 mg total) by mouth 2 (two) times daily as needed for anxiety. 15 tablet 0  . sertraline (ZOLOFT) 50 MG tablet Take 1 tablet (50 mg total) by mouth daily. 30 tablet 3   No current facility-administered medications on file prior to visit.     ROS see history of present illness  Objective  Physical  Exam Vitals:   09/25/20 1626  BP: 110/70  Pulse: 73  Temp: (!) 97.3 F (36.3 C)  SpO2: 97%   Laying blood pressure 127/70 pulse 69 Sitting blood pressure 128/73 pulse 70 Standing blood pressure 145/79 pulse 76  BP Readings from Last 3 Encounters:  09/25/20 110/70  09/09/20 120/80  08/03/20 140/70   Wt Readings from Last 3 Encounters:  09/25/20 207 lb 6.4 oz (94.1 kg)  09/09/20 215 lb 3.2 oz (97.6 kg)  08/03/20 215 lb (97.5 kg)    Physical Exam Constitutional:      General: He is not in acute distress.    Appearance: He is not diaphoretic.  HENT:     Head: Normocephalic and atraumatic. No raccoon eyes or Battle's sign.     Comments: No bruising or lesions noted on the posterior scalp where he reports hitting his head, no tenderness in this area    Right Ear: Tympanic membrane normal. No hemotympanum.     Left Ear: Tympanic membrane normal. No hemotympanum.  Cardiovascular:     Rate and Rhythm: Normal rate and regular rhythm.     Heart sounds: Normal heart sounds.  Pulmonary:     Effort: Pulmonary effort is normal.     Breath sounds: Normal breath sounds.  Skin:  General: Skin is warm and dry.  Neurological:     Mental Status: He is alert.     Comments: EOMI, PERRL, V1 through V3 intact to light touch bilaterally, hearing intact to finger rub, shoulder shrug intact, 5/5 strength in bilateral biceps, triceps, grip, quads, hamstrings, plantar and dorsiflexion, sensation to light touch intact in bilateral UE and LE, normal gait      Assessment/Plan: Please see individual problem list.  Problem List Items Addressed This Visit    Pituitary tumor   Relevant Orders   Prolactin   Cortisol   Testosterone   TSH   Anxiety - Primary    The patient's anxiety seem to have been improving on the Zoloft.  On review it does appear that there is a chance of serotonin syndrome with cabergoline in combination with an SSRI.  The patient's description of symptoms do not fit with  serotonin syndrome.  I discussed this combination with our clinical pharmacist who agreed that this did not seem like serotonin syndrome and felt as though it would be okay to continue with this combination with the patient monitoring for any serotonin syndrome symptoms.  I discussed possible symptoms including fever, agitation, muscle rigidity, elevated heart rate, and hypertension with the patient and advise that if he developed any of those while taking this combination he needs to let us know right away.  He will resume his Zoloft at this time.  We are going to check some labs as well given his pituitary tumor history to ensure that some hormone levels are not contributing to his symptoms.      Relevant Orders   CBC   Comp Met (CMET)   TSH   Lightheadedness    Certainly the patient could have gotten orthostatic or had a vasovagal episode given his sensation of feeling overheated.  Based on his history it does not sound as though he actually lost consciousness.  I did discuss doing an EKG given the presyncopal nature of his complaints though he had 1 in the ED not too long ago and thus this was deferred by the patient.  We will obtain lab work to evaluate for other contributing causes to this episode.           This visit occurred during the SARS-CoV-2 public health emergency.  Safety protocols were in place, including screening questions prior to the visit, additional usage of staff PPE, and extensive cleaning of exam room while observing appropriate contact time as indicated for disinfecting solutions.    [name], MD [place]

## 2020-09-25 NOTE — Patient Instructions (Signed)
Nice to see you. I think it would be okay for you to resume the Zoloft.  If you have recurrence of the symptoms that you described with the feeling overheated or if you develop agitation, elevated temperature, muscle rigidity, elevated heart rate, or hypertension please discontinue the Zoloft and let us know right away. We will get lab work next week to evaluate for other causes of your symptoms.

## 2020-09-27 DIAGNOSIS — R42 Dizziness and giddiness: Secondary | ICD-10-CM | POA: Insufficient documentation

## 2020-09-27 NOTE — Assessment & Plan Note (Signed)
Certainly the patient could have gotten orthostatic or had a vasovagal episode given his sensation of feeling overheated.  Based on his history it does not sound as though he actually lost consciousness.  I did discuss doing an EKG given the presyncopal nature of his complaints though he had 1 in the ED not too long ago and thus this was deferred by the patient.  We will obtain lab work to evaluate for other contributing causes to this episode.

## 2020-09-27 NOTE — Assessment & Plan Note (Addendum)
The patient's anxiety seem to have been improving on the Zoloft.  On review it does appear that there is a chance of serotonin syndrome with cabergoline in combination with an SSRI.  The patient's description of symptoms do not fit with serotonin syndrome.  I discussed this combination with our clinical pharmacist who agreed that this did not seem like serotonin syndrome and felt as though it would be okay to continue with this combination with the patient monitoring for any serotonin syndrome symptoms.  I discussed possible symptoms including fever, agitation, muscle rigidity, elevated heart rate, and hypertension with the patient and advise that if he developed any of those while taking this combination he needs to let us know right away.  He will resume his Zoloft at this time.  We are going to check some labs as well given his pituitary tumor history to ensure that some hormone levels are not contributing to his symptoms.

## 2020-09-28 ENCOUNTER — Ambulatory Visit: Payer: BC Managed Care – PPO | Admitting: Family Medicine

## 2020-09-30 ENCOUNTER — Other Ambulatory Visit: Payer: Self-pay

## 2020-09-30 ENCOUNTER — Other Ambulatory Visit (INDEPENDENT_AMBULATORY_CARE_PROVIDER_SITE_OTHER): Payer: BC Managed Care – PPO

## 2020-09-30 DIAGNOSIS — F419 Anxiety disorder, unspecified: Secondary | ICD-10-CM | POA: Diagnosis not present

## 2020-09-30 DIAGNOSIS — D497 Neoplasm of unspecified behavior of endocrine glands and other parts of nervous system: Secondary | ICD-10-CM | POA: Diagnosis not present

## 2020-09-30 LAB — CBC
HCT: 45.9 % (ref 39.0–52.0)
Hemoglobin: 15.9 g/dL (ref 13.0–17.0)
MCHC: 34.7 g/dL (ref 30.0–36.0)
MCV: 91 fl (ref 78.0–100.0)
Platelets: 301 10*3/uL (ref 150.0–400.0)
RBC: 5.04 Mil/uL (ref 4.22–5.81)
RDW: 12.6 % (ref 11.5–15.5)
WBC: 5.9 10*3/uL (ref 4.0–10.5)

## 2020-09-30 LAB — COMPREHENSIVE METABOLIC PANEL
ALT: 31 U/L (ref 0–53)
AST: 48 U/L — ABNORMAL HIGH (ref 0–37)
Albumin: 4.2 g/dL (ref 3.5–5.2)
Alkaline Phosphatase: 67 U/L (ref 39–117)
BUN: 17 mg/dL (ref 6–23)
CO2: 30 mEq/L (ref 19–32)
Calcium: 9.5 mg/dL (ref 8.4–10.5)
Chloride: 101 mEq/L (ref 96–112)
Creatinine, Ser: 1.18 mg/dL (ref 0.40–1.50)
GFR: 74.76 mL/min (ref >60.00)
Glucose, Bld: 79 mg/dL (ref 70–99)
Potassium: 4.2 mEq/L (ref 3.5–5.1)
Sodium: 138 mEq/L (ref 135–145)
Total Bilirubin: 0.5 mg/dL (ref 0.2–1.2)
Total Protein: 6.9 g/dL (ref 6.0–8.3)

## 2020-09-30 LAB — CORTISOL: Cortisol, Plasma: 9.3 ug/dL

## 2020-09-30 LAB — TSH: TSH: 1.7 u[IU]/mL (ref 0.35–4.50)

## 2020-09-30 LAB — TESTOSTERONE: Testosterone: 630.92 ng/dL (ref 300.00–890.00)

## 2020-09-30 LAB — PROLACTIN: Prolactin: 4.9 ng/mL (ref 2.0–18.0)

## 2020-10-05 ENCOUNTER — Other Ambulatory Visit: Payer: Self-pay | Admitting: Family Medicine

## 2020-10-05 DIAGNOSIS — R7989 Other specified abnormal findings of blood chemistry: Secondary | ICD-10-CM

## 2020-10-05 DIAGNOSIS — R945 Abnormal results of liver function studies: Secondary | ICD-10-CM

## 2020-10-06 ENCOUNTER — Telehealth: Payer: Self-pay

## 2020-10-06 NOTE — Telephone Encounter (Signed)
Pt returned your call about labs 

## 2020-10-08 NOTE — Telephone Encounter (Signed)
I  called and spoke with the patient and informed him that the provider wants him to have labs patient stated he would cal back once he looks at his calendar.  Dimitriy Carreras,cma

## 2020-10-27 ENCOUNTER — Ambulatory Visit: Payer: BC Managed Care – PPO | Admitting: Family Medicine

## 2020-11-18 ENCOUNTER — Other Ambulatory Visit: Payer: Self-pay

## 2020-11-18 ENCOUNTER — Ambulatory Visit: Payer: BC Managed Care – PPO | Admitting: Family Medicine

## 2020-11-18 ENCOUNTER — Other Ambulatory Visit (INDEPENDENT_AMBULATORY_CARE_PROVIDER_SITE_OTHER): Payer: BC Managed Care – PPO

## 2020-11-18 DIAGNOSIS — R7989 Other specified abnormal findings of blood chemistry: Secondary | ICD-10-CM

## 2020-11-18 DIAGNOSIS — R945 Abnormal results of liver function studies: Secondary | ICD-10-CM

## 2020-11-19 LAB — HEPATIC FUNCTION PANEL
ALT: 37 U/L (ref 0–53)
AST: 60 U/L — ABNORMAL HIGH (ref 0–37)
Albumin: 4.2 g/dL (ref 3.5–5.2)
Alkaline Phosphatase: 62 U/L (ref 39–117)
Bilirubin, Direct: 0.1 mg/dL (ref 0.0–0.3)
Total Bilirubin: 0.4 mg/dL (ref 0.2–1.2)
Total Protein: 7.1 g/dL (ref 6.0–8.3)

## 2020-11-26 ENCOUNTER — Ambulatory Visit: Payer: BC Managed Care – PPO | Admitting: Family Medicine

## 2021-01-01 ENCOUNTER — Ambulatory Visit: Payer: BC Managed Care – PPO | Admitting: Family Medicine

## 2021-01-01 ENCOUNTER — Other Ambulatory Visit: Payer: Self-pay

## 2021-01-01 ENCOUNTER — Encounter: Payer: Self-pay | Admitting: Family Medicine

## 2021-01-01 VITALS — BP 130/70 | HR 81 | Temp 98.2°F | Ht 72.0 in | Wt 211.6 lb

## 2021-01-01 DIAGNOSIS — F419 Anxiety disorder, unspecified: Secondary | ICD-10-CM | POA: Diagnosis not present

## 2021-01-01 DIAGNOSIS — R7989 Other specified abnormal findings of blood chemistry: Secondary | ICD-10-CM | POA: Insufficient documentation

## 2021-01-01 DIAGNOSIS — Z1211 Encounter for screening for malignant neoplasm of colon: Secondary | ICD-10-CM

## 2021-01-01 DIAGNOSIS — R945 Abnormal results of liver function studies: Secondary | ICD-10-CM | POA: Diagnosis not present

## 2021-01-01 NOTE — Assessment & Plan Note (Addendum)
I discussed that the Tylenol use could be playing a role with this.  I advised him to lay off of Tylenol.  Discussed if he has discomfort he could try ibuprofen or Aleve over-the-counter.  Discussed ibuprofen 600 mg every 8 hours as needed or Aleve 1 tablet twice daily as needed.  Discussed taking these with food.  We will recheck his liver enzymes today.

## 2021-01-01 NOTE — Progress Notes (Signed)
[name], MD Phone: [phone]  Jonathan Day is a 45 y.o. male who presents today for follow-up.  Anxiety: Patient notes this is very well controlled.  He notes no current anxiety.  No depression.  He has not needed any clonazepam recently.  He is taking Zoloft daily.  Abnormal LFTs: Patient had an elevated AST on his last lab work.  He notes no excessive alcohol intake though there are periods of time where he will take up to 3000 mg of Tylenol daily for 7 to 10 days.  He notes no abdominal pain.  Social History   Tobacco Use  Smoking Status Never  Smokeless Tobacco Never    Current Outpatient Medications on File Prior to Visit  Medication Sig Dispense Refill   cabergoline (DOSTINEX) 0.5 MG tablet Take 0.25 mg by mouth 2 (two) times a week.     clonazePAM (KLONOPIN) 0.5 MG tablet Take 0.5 tablets (0.25 mg total) by mouth 2 (two) times daily as needed for anxiety. 15 tablet 0   sertraline (ZOLOFT) 50 MG tablet Take 1 tablet (50 mg total) by mouth daily. 30 tablet 3   No current facility-administered medications on file prior to visit.     ROS see history of present illness  Objective  Physical Exam Vitals:   01/01/21 1559  BP: 130/70  Pulse: 81  Temp: 98.2 F (36.8 C)  SpO2: 97%    BP Readings from Last 3 Encounters:  01/01/21 130/70  09/25/20 110/70  09/09/20 120/80   Wt Readings from Last 3 Encounters:  01/01/21 211 lb 9.6 oz (96 kg)  09/25/20 207 lb 6.4 oz (94.1 kg)  09/09/20 215 lb 3.2 oz (97.6 kg)    Physical Exam Constitutional:      General: He is not in acute distress.    Appearance: He is not diaphoretic.  Cardiovascular:     Rate and Rhythm: Normal rate and regular rhythm.     Heart sounds: Normal heart sounds.  Pulmonary:     Effort: Pulmonary effort is normal.     Breath sounds: Normal breath sounds.  Abdominal:     General: There is no distension.     Palpations: Abdomen is soft.     Tenderness: There is no abdominal  tenderness.  Skin:    General: Skin is warm and dry.  Neurological:     Mental Status: He is alert.     Assessment/Plan: Please see individual problem list.  Problem List Items Addressed This Visit     Anxiety - Primary    Currently asymptomatic.  He will continue Zoloft 50 mg once daily.  We will have him follow-up in 6 months and consider the potential for tapering off of this at that time.  If he has any worsening symptoms he will let us know.       Colon cancer screening    Patient has been referred to GI.       Relevant Orders   Ambulatory referral to Gastroenterology   LFTs abnormal    I discussed that the Tylenol use could be playing a role with this.  I advised him to lay off of Tylenol.  Discussed if he has discomfort he could try ibuprofen or Aleve over-the-counter.  Discussed ibuprofen 600 mg every 8 hours as needed or Aleve 1 tablet twice daily as needed.  Discussed taking these with food.  We will recheck his liver enzymes today.       Relevant Orders   Hepatic function  panel     Health Maintenance: The patient reports he received 3 COVID vaccines.  He will get [place] a copy of his booster card.  Return in about 6 months (around 07/04/2021) for Anxiety.  This visit occurred during the SARS-CoV-2 public health emergency.  Safety protocols were in place, including screening questions prior to the visit, additional usage of staff PPE, and extensive cleaning of exam room while observing appropriate contact time as indicated for disinfecting solutions.    [name], MD [place]

## 2021-01-01 NOTE — Patient Instructions (Signed)
Nice to see you. Please continue your Zoloft. Somebody from GI should contact you to schedule colonoscopy.

## 2021-01-01 NOTE — Assessment & Plan Note (Signed)
Currently asymptomatic.  He will continue Zoloft 50 mg once daily.  We will have him follow-up in 6 months and consider the potential for tapering off of this at that time.  If he has any worsening symptoms he will let us know.

## 2021-01-01 NOTE — Assessment & Plan Note (Signed)
Patient has been referred to GI 

## 2021-01-02 LAB — HEPATIC FUNCTION PANEL
AG Ratio: 1.6 (calc) (ref 1.0–2.5)
ALT: 28 U/L (ref 9–46)
AST: 44 U/L — ABNORMAL HIGH (ref 10–40)
Albumin: 4.3 g/dL (ref 3.6–5.1)
Alkaline phosphatase (APISO): 66 U/L (ref 36–130)
Bilirubin, Direct: 0.1 mg/dL (ref 0.0–0.2)
Globulin: 2.7 g/dL (calc) (ref 1.9–3.7)
Indirect Bilirubin: 0.3 mg/dL (calc) (ref 0.2–1.2)
Total Bilirubin: 0.4 mg/dL (ref 0.2–1.2)
Total Protein: 7 g/dL (ref 6.1–8.1)

## 2021-01-13 ENCOUNTER — Other Ambulatory Visit: Payer: Self-pay | Admitting: Family Medicine

## 2021-01-13 DIAGNOSIS — F419 Anxiety disorder, unspecified: Secondary | ICD-10-CM

## 2021-01-26 ENCOUNTER — Other Ambulatory Visit: Payer: Self-pay

## 2021-01-26 ENCOUNTER — Ambulatory Visit
Admission: RE | Admit: 2021-01-26 | Discharge: 2021-01-26 | Disposition: A | Discharge status: Home / Self Care | Payer: BC Managed Care – PPO | Source: Ambulatory Visit | Attending: Physician Assistant | Admitting: Physician Assistant

## 2021-01-26 VITALS — BP 159/72 | HR 86 | Temp 98.7°F | Resp 15 | Ht 72.0 in | Wt 214.0 lb

## 2021-01-26 DIAGNOSIS — M5441 Lumbago with sciatica, right side: Secondary | ICD-10-CM | POA: Diagnosis not present

## 2021-01-26 DIAGNOSIS — S161XXA Strain of muscle, fascia and tendon at neck level, initial encounter: Secondary | ICD-10-CM

## 2021-01-26 MED ORDER — CYCLOBENZAPRINE HCL 10 MG PO TABS: 10.0000 mg | ORAL_TABLET | Freq: Three times a day (TID) | ORAL | 0 refills | Status: DC | PRN

## 2021-01-26 MED ORDER — PREDNISONE 10 MG PO TABS: ORAL_TABLET | ORAL | 0 refills | Status: DC
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
## 2021-01-26 NOTE — Discharge Instructions (Addendum)
BACK PAIN: Stressed avoiding painful activities . RICE (REST, ICE, COMPRESSION, ELEVATION) guidelines reviewed. May alternate ice and heat. Consider use of muscle rubs, Salonpas patches, etc. Use medications as directed including muscle relaxers if prescribed. Take anti-inflammatory medications as prescribed or OTC NSAIDs/Tylenol.  F/u with PCP in 7-10 days for reexamination, and please feel free to call or return to the urgent care at any time for any questions or concerns you may have and we will be happy to help you!   BACK PAIN RED FLAGS: If the back pain acutely worsens or there are any red flag symptoms such as numbness/tingling, leg weakness, saddle anesthesia, or loss of bowel/bladder control, go immediately to the ER. Follow up with [place] as scheduled or sooner if the pain does not begin to resolve or if it worsens before the follow up    NECK PAIN: Stressed avoiding painful activities. This can exacerbate your symptoms and make them worse.  May apply heat to the areas of pain for some relief. Use medications as directed. Be aware of which medications make you drowsy and do not drive or operate any kind of heavy machinery while using the medication (ie pain medications or muscle relaxers). F/U with PCP for reexamination or return sooner if condition worsens or does not begin to improve over the next few days.   NECK PAIN RED FLAGS: If symptoms get worse than they are right now, you should come back sooner for re-evaluation. If you have increased numbness/ tingling or notice that the numbness/tingling is affecting the legs or saddle region, go to ER. If you ever lose continence go to ER.      You may have a condition requiring you to follow up with Orthopedics so please call one of the following office for appointment:   [facility] [address] [place], [place] [address] Phone: [phone]  [facility] [address] [place], [place] [address] Phone: [phone]

## 2021-02-04 ENCOUNTER — Telehealth: Payer: BC Managed Care – PPO | Admitting: Physician Assistant

## 2021-02-04 DIAGNOSIS — M545 Low back pain, unspecified: Secondary | ICD-10-CM

## 2021-02-04 NOTE — Progress Notes (Signed)
Based on what you shared with me, I feel your condition warrants further evaluation and I recommend that you be seen in a face to face visit.  As you were previously directed, you do need to follow-up with your primary care provider so they can evaluate and proceed with further evaluation and management of this issue whether that is with MRI, PT, etc or getting you in with Neurosurgery. Please reach out to your PCP ASAP.    NOTE: There will be NO CHARGE for this eVisit   If you are having a true medical emergency please call 911.      For an urgent face to face visit, [facility] [place] has six urgent care centers for your convenience:     [facility] [place] [facility] [place] Get Driving Directions [id number] [place], [place] [address]    [facility] Get Driving Directions [id number] [place], [place] [address]  [facility] [place] [facility] Get Driving Directions [id number] [address] [place],  [place]  [address]  [facility] Get Driving Directions [id number] [place], [place], [place] [address]   [facility] Get Driving Directions  [id number] [address] [place], [place] [address]   [facility] [place] Get Driving Directions [id number] [address], [place], [place] [address]  Your MyChart E-visit questionnaire answers were reviewed by a board certified advanced clinical practitioner to complete your personal care plan based on your specific symptoms.  Thank you for using e-Visits.

## 2021-02-15 DIAGNOSIS — S39012A Strain of muscle, fascia and tendon of lower back, initial encounter: Secondary | ICD-10-CM | POA: Diagnosis not present

## 2021-02-22 ENCOUNTER — Ambulatory Visit: Payer: BC Managed Care – PPO | Admitting: Adult Health

## 2021-03-16 ENCOUNTER — Encounter: Payer: Self-pay | Admitting: Family Medicine

## 2021-03-16 DIAGNOSIS — Z1211 Encounter for screening for malignant neoplasm of colon: Secondary | ICD-10-CM

## 2021-06-21 ENCOUNTER — Other Ambulatory Visit: Payer: Self-pay | Admitting: Family Medicine

## 2021-06-21 DIAGNOSIS — F419 Anxiety disorder, unspecified: Secondary | ICD-10-CM

## 2021-07-09 ENCOUNTER — Encounter: Payer: Self-pay | Admitting: Family Medicine

## 2021-07-09 ENCOUNTER — Ambulatory Visit: Payer: BC Managed Care – PPO | Admitting: Family Medicine

## 2021-07-09 ENCOUNTER — Other Ambulatory Visit: Payer: Self-pay

## 2021-07-09 VITALS — BP 140/80 | HR 82 | Temp 97.5°F | Ht 72.0 in | Wt 216.6 lb

## 2021-07-09 DIAGNOSIS — Z5181 Encounter for therapeutic drug level monitoring: Secondary | ICD-10-CM

## 2021-07-09 DIAGNOSIS — M545 Low back pain, unspecified: Secondary | ICD-10-CM

## 2021-07-09 DIAGNOSIS — Z23 Encounter for immunization: Secondary | ICD-10-CM | POA: Diagnosis not present

## 2021-07-09 DIAGNOSIS — F419 Anxiety disorder, unspecified: Secondary | ICD-10-CM

## 2021-07-09 DIAGNOSIS — G8929 Other chronic pain: Secondary | ICD-10-CM | POA: Diagnosis not present

## 2021-07-09 LAB — BASIC METABOLIC PANEL
BUN/Creatinine Ratio: 12 (calc) (ref 6–22)
BUN: 16 mg/dL (ref 7–25)
CO2: 28 mmol/L (ref 20–32)
Calcium: 9.2 mg/dL (ref 8.6–10.3)
Chloride: 99 mmol/L (ref 98–110)
Creat: 1.32 mg/dL — ABNORMAL HIGH (ref 0.60–1.29)
Glucose, Bld: 150 mg/dL — ABNORMAL HIGH (ref 65–99)
Potassium: 4 mmol/L (ref 3.5–5.3)
Sodium: 138 mmol/L (ref 135–146)
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
## 2021-07-09 NOTE — Assessment & Plan Note (Signed)
Asymptomatic.  We will try to taper off of Zoloft.  He will decrease his dose to 25 mg daily for 14 days and then discontinue it.  If he has any worsening symptoms of anxiety he can go back to 1 tablet daily.  He will let us know if he has to go back on the medication.

## 2021-07-09 NOTE — Patient Instructions (Signed)
Nice to see you. Please try to reduce your Zoloft dose to 25 mg (half a tablet) once daily for 2 weeks and then discontinue it to see if you are able to come off of this medication.  If you have increasing anxiety or any of the issues she had previously you can always go back to the 1 tablet daily. If your backs not improving over the next few weeks please contact orthopedics for follow-up.
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
## 2021-07-28 ENCOUNTER — Telehealth: Payer: Self-pay

## 2021-07-28 NOTE — Telephone Encounter (Signed)
Lvm for pt to return call to get scheduled for lab appt to recheck BMP.  ?

## 2021-07-29 NOTE — Progress Notes (Signed)
I made another attempt to reach the patient by phone and had no success, a letter was mailed today to the patient to call and schedule a lab visit to recheck his kidney function.  Jaquelin Meaney,cma  ?

## 2021-09-30 ENCOUNTER — Ambulatory Visit
Admission: EM | Admit: 2021-09-30 | Discharge: 2021-09-30 | Disposition: A | Discharge status: Home / Self Care | Payer: BC Managed Care – PPO | Attending: Physician Assistant | Admitting: Physician Assistant

## 2021-09-30 ENCOUNTER — Other Ambulatory Visit: Payer: Self-pay

## 2021-09-30 DIAGNOSIS — W540XXA Bitten by dog, initial encounter: Secondary | ICD-10-CM

## 2021-09-30 DIAGNOSIS — S81002A Unspecified open wound, left knee, initial encounter: Secondary | ICD-10-CM

## 2021-09-30 MED ORDER — MUPIROCIN 2 % EX OINT: 1.0000 "application " | TOPICAL_OINTMENT | Freq: Two times a day (BID) | CUTANEOUS | 0 refills | Status: DC

## 2021-09-30 MED ORDER — AMOXICILLIN-POT CLAVULANATE 875-125 MG PO TABS: 1.0000 | ORAL_TABLET | Freq: Two times a day (BID) | ORAL | 0 refills | Status: DC

## 2021-09-30 NOTE — ED Triage Notes (Signed)
Pt got bit by his dog on the left knee. Pt has 7 cuts to the left knee and 1 long cut on the right knee.  ? ?Pt states that the bite happened at 7pm. ?

## 2021-09-30 NOTE — ED Provider Notes (Signed)
?[place] ? ? ? ?CSN: [id number] ?Arrival date & time: 09/30/21  1903 ? ? ?  ? ?History   ?Chief Complaint ?Chief Complaint  ?Patient presents with  ? Animal Bite  ? ? ?HPI ?Jonathan Day is a 46 y.o. male presenting for lacerations to his left knee and a abrasion to the right knee.  Patient states he was breaking of his dogs at home.  One of his dogs has food aggression.  Also reports one of his dogs with a very large mastiff.  He says he was breaking up the dogs and one of his dogs bit his leg.  He reports this happened about 30 minutes prior to arrival.  He says he cleaned it with hydrogen peroxide but thought he might need to have a stitch placed.  Patient says his dogs are up-to-date on immunizations including rabies.  He reports he had a tetanus within the past 5 years.  Patient denies any significant pain at this time.  Concerned about preventing possible infection.  He has no other injuries.  No other complaints. ? ?HPI ? ?Past Medical History:  ?Diagnosis Date  ? Chicken pox   ? Pituitary tumor   ? ? ?Patient Active Problem List  ? Diagnosis Date Noted  ? Low back pain 07/09/2021  ? Colon cancer screening 01/01/2021  ? LFTs abnormal 01/01/2021  ? Lightheadedness 09/27/2020  ? Anxiety 08/06/2020  ? Borderline blood pressure 08/18/2017  ? Prostate cancer screening 08/18/2017  ? Pituitary tumor 02/14/2017  ? Cervical radiculopathy 02/14/2017  ? ? ?Past Surgical History:  ?Procedure Laterality Date  ? APPENDECTOMY  2003  ? HAND SURGERY Right 2000  ? ? ? ? ? ?Home Medications   ? ?Prior to Admission medications   ?Medication Sig Start Date End Date Taking? Authorizing Provider  ?amoxicillin-clavulanate (AUGMENTIN) 875-125 MG tablet Take 1 tablet by mouth every 12 (twelve) hours. 09/30/21  Yes Danton Clap, PA-C  ?cabergoline (DOSTINEX) 0.5 MG tablet Take 0.25 mg by mouth 2 (two) times a week. 09/21/20  Yes [provider]  ?clonazePAM (KLONOPIN) 0.5 MG tablet Take 0.5 tablets (0.25 mg  total) by mouth 2 (two) times daily as needed for anxiety. 09/18/20  Yes Leone Haven, MD  ?meloxicam (MOBIC) 15 MG tablet Take 15 mg by mouth daily. 06/19/21  Yes [provider]  ?methocarbamol (ROBAXIN) 500 MG tablet Take 500-1,000 mg by mouth every 6 (six) hours as needed. 03/04/21  Yes [provider]  ?mupirocin ointment (BACTROBAN) 2 % Apply 1 application. topically 2 (two) times daily. 09/30/21  Yes Laurene Footman B, PA-C  ?sertraline (ZOLOFT) 50 MG tablet TAKE 1 TABLET BY MOUTH EVERY DAY 06/21/21  Yes Leone Haven, MD  ? ? ?Family History ?Family History  ?Problem Relation Age of Onset  ? Diabetes Mother   ? Hyperlipidemia Mother   ? Diabetes Maternal Grandmother   ? Hypertension Maternal Grandfather   ? ? ?Social History ?Social History  ? ?Tobacco Use  ? Smoking status: Never  ? Smokeless tobacco: Never  ?Vaping Use  ? Vaping Use: Never used  ?Substance Use Topics  ? Alcohol use: Yes  ?  Comment: occasional  ? Drug use: No  ? ? ? ?Allergies   ?Patient has no known allergies. ? ? ?Review of Systems ?Review of Systems  ?Musculoskeletal:  Negative for arthralgias, gait problem and joint swelling.  ?Skin:  Positive for wound. Negative for color change.  ?Neurological:  Negative for weakness and  numbness.  ? ? ?Physical Exam ?Triage Vital Signs ?ED Triage Vitals  ?Enc Vitals Group  ?   BP --   ?   Pulse --   ?   Resp --   ?   Temp --   ?   Temp src --   ?   SpO2 --   ?   Weight 09/30/21 1930 215 lb (97.5 kg)  ?   Height 09/30/21 1930 6' (1.829 m)  ?   Head Circumference --   ?   Peak Flow --   ?   Pain Score 09/30/21 1929 6  ?   Pain Loc --   ?   Pain Edu? --   ?   Excl. in [place]? --   ? ?No data found. ? ?Updated Vital Signs ?BP (!) 154/90 (BP Location: Left Arm)   Pulse 86   Temp 98.6 ?F (37 ?C) (Oral)   Resp 18   Ht 6' (1.829 m)   Wt 215 lb (97.5 kg)   SpO2 97%   BMI 29.16 kg/m?  ?   ? ?Physical Exam ?Vitals and nursing note reviewed.  ?Constitutional:   ?   General: He is not in  acute distress. ?   Appearance: Normal appearance. He is well-developed. He is not ill-appearing.  ?HENT:  ?   Head: Normocephalic and atraumatic.  ?Eyes:  ?   General: No scleral icterus. ?   Conjunctiva/sclera: Conjunctivae normal.  ?Cardiovascular:  ?   Rate and Rhythm: Normal rate.  ?   Pulses: Normal pulses.  ?Pulmonary:  ?   Effort: Pulmonary effort is normal. No respiratory distress.  ?Musculoskeletal:  ?   Cervical back: Neck supple.  ?Skin: ?   General: Skin is warm and dry.  ?   Capillary Refill: Capillary refill takes less than 2 seconds.  ?   Comments: See image below.  There is an open wound of the left anterior knee that measures about 1-1/2 cm.  It is actively bleeding.  There are a few other surrounding puncture wounds.  Also, superficial abrasion of the right thigh/knee.  Full range of motion of both knees without any evidence of swelling.  No significant pain.  Areas were cleansed thoroughly with wound cleanser and bandaged  ?Neurological:  ?   General: No focal deficit present.  ?   Mental Status: He is alert. Mental status is at baseline.  ?   Motor: No weakness.  ?   Gait: Gait normal.  ?Psychiatric:     ?   Mood and Affect: Mood normal.     ?   Behavior: Behavior normal.     ?   Thought Content: Thought content normal.  ? ? ? ? ?UC Treatments / Results  ?Labs ?(all labs ordered are listed, but only abnormal results are displayed) ?Labs Reviewed - No data to display ? ?EKG ? ? ?Radiology ?No results found. ? ?Procedures ?Procedures (including critical care time) ? ?Medications Ordered in UC ?Medications - No data to display ? ?Initial Impression / Assessment and Plan / UC Course  ?I have reviewed the triage vital signs and the nursing notes. ? ?Pertinent labs & imaging results that were available during my care of the patient were reviewed by me and considered in my medical decision making (see chart for details). ? ?46 year old male presenting for open wounds to the left knee following dog bite  from his own dog which she states is up-to-date with all immunizations including rabies  patient is up-to-date with tetanus immunization.  We have cleansed the wounds today with wound cleanser and apply nonadherent bandages.  We will treat prophylactically with antibiotics.  Sent Augmentin and mupirocin ointment to pharmacy.  Reviewed close monitoring and to return if any worsening of symptoms or signs of infection.  Nursing staff reported the incident to animal control. ? ? ?Final Clinical Impressions(s) / UC Diagnoses  ? ?Final diagnoses:  ?Open wound of left knee, initial encounter  ?Dog bite, initial encounter  ? ? ? ?Discharge Instructions   ? ?  ? ?DOG BITE: The area has been cleaned and bandaged today. Wound care guidelines have been reviewed by provider. Begin prophylactic antibiotics today and continue to monitor for signs of infection. Ice the area. May take NSAIDs for pain relief and any swelling. F/u in 24-48 hrs or sooner if signs of infection. This incident will be reported to Animal Control and  you will be contacted for more information.  ? ? ? ? ?ED Prescriptions   ? ? Medication Sig Dispense Auth. Provider  ? amoxicillin-clavulanate (AUGMENTIN) 875-125 MG tablet Take 1 tablet by mouth every 12 (twelve) hours. 14 tablet Laurene Footman B, PA-C  ? mupirocin ointment (BACTROBAN) 2 % Apply 1 application. topically 2 (two) times daily. 22 g Danton Clap, PA-C  ? ?  ? ?PDMP not reviewed this encounter. ?  ?Danton Clap, PA-C ?10/05/21 3903 ? ?

## 2021-09-30 NOTE — Discharge Instructions (Addendum)
?  DOG BITE: The area has been cleaned and bandaged today. Wound care guidelines have been reviewed by provider. Begin prophylactic antibiotics today and continue to monitor for signs of infection. Ice the area. May take NSAIDs for pain relief and any swelling. F/u in 24-48 hrs or sooner if signs of infection. This incident will be reported to Animal Control and  you will be contacted for more information.  ?

## 2021-10-26 ENCOUNTER — Other Ambulatory Visit: Payer: Self-pay | Admitting: Family Medicine

## 2021-10-26 DIAGNOSIS — F419 Anxiety disorder, unspecified: Secondary | ICD-10-CM

## 2022-02-23 ENCOUNTER — Other Ambulatory Visit: Payer: Self-pay | Admitting: Family Medicine

## 2022-02-23 DIAGNOSIS — F419 Anxiety disorder, unspecified: Secondary | ICD-10-CM

## 2022-07-13 ENCOUNTER — Ambulatory Visit (INDEPENDENT_AMBULATORY_CARE_PROVIDER_SITE_OTHER): Payer: BC Managed Care – PPO | Admitting: Family Medicine

## 2022-07-13 ENCOUNTER — Encounter: Payer: Self-pay | Admitting: Family Medicine

## 2022-07-13 VITALS — BP 160/92 | HR 88 | Temp 98.4°F | Resp 16 | Ht 70.25 in | Wt 224.0 lb

## 2022-07-13 DIAGNOSIS — Z13220 Encounter for screening for lipoid disorders: Secondary | ICD-10-CM

## 2022-07-13 DIAGNOSIS — Z0001 Encounter for general adult medical examination with abnormal findings: Secondary | ICD-10-CM | POA: Diagnosis not present

## 2022-07-13 DIAGNOSIS — Z6831 Body mass index (BMI) 31.0-31.9, adult: Secondary | ICD-10-CM

## 2022-07-13 DIAGNOSIS — E669 Obesity, unspecified: Secondary | ICD-10-CM

## 2022-07-13 DIAGNOSIS — Z125 Encounter for screening for malignant neoplasm of prostate: Secondary | ICD-10-CM | POA: Diagnosis not present

## 2022-07-13 DIAGNOSIS — R030 Elevated blood-pressure reading, without diagnosis of hypertension: Secondary | ICD-10-CM

## 2022-07-13 DIAGNOSIS — R0981 Nasal congestion: Secondary | ICD-10-CM | POA: Diagnosis not present

## 2022-07-13 DIAGNOSIS — Z23 Encounter for immunization: Secondary | ICD-10-CM | POA: Diagnosis not present

## 2022-07-13 DIAGNOSIS — Z1211 Encounter for screening for malignant neoplasm of colon: Secondary | ICD-10-CM

## 2022-07-13 LAB — POC COVID19 BINAXNOW: SARS Coronavirus 2 Ag: NEGATIVE

## 2022-07-13 NOTE — Assessment & Plan Note (Addendum)
Discussed this possibly could be viral including COVID or could be related to allergies.  Rapid COVID test done today.  We opted to have the patient do another COVID test tomorrow to confirm that it remains negative as opposed to sending a PCR test.  Discussed he could try Allegra or Flonase over-the-counter for his symptoms.

## 2022-07-13 NOTE — Assessment & Plan Note (Signed)
Elevated today.  He reports he recently took some cold medication which could be contributing to this.  I will have him return in 3 to 4 weeks for nurse BP check.

## 2022-07-13 NOTE — Progress Notes (Addendum)
Jonathan Rumps, MD Phone: [phone]  Jonathan Day is a 47 y.o. male who presents today for CPE.  Diet: not great recently though when he eats out he tries to make the healthy choice, no soda or sweet tea Exercise: weights 4x/week Colonoscopy: due Prostate cancer screening: due Family history-  Prostate cancer: no  Colon cancer: no Vaccines-   Flu: due  Tetanus: UTD-reports had a few years ago at urgent care  COVID19: x2, declines further HIV screening: declines Hep C Screening: declines Tobacco use: no Alcohol use: 3-4 beers on the weekend Illicit Drug use: no Dentist: yes Ophthalmology: yes  Sinus congestion: Patient notes this started yesterday.  It was worse this morning.  He notes some sneezing.  No cough or shortness of breath.  No COVID exposure.  No history of allergies.  He did take some cough medication recently.  Active Ambulatory Problems    Diagnosis Date Noted   Pituitary tumor 02/14/2017   Cervical radiculopathy 02/14/2017   Elevated blood pressure reading 08/18/2017   Prostate cancer screening 08/18/2017   Anxiety 08/06/2020   Lightheadedness 09/27/2020   Colon cancer screening 01/01/2021   LFTs abnormal 01/01/2021   Low back pain 07/09/2021   Encounter for general adult medical examination with abnormal findings 07/13/2022   Sinus congestion 07/13/2022   Resolved Ambulatory Problems    Diagnosis Date Noted   Encounter for preventative adult health care exam with abnormal findings 01/06/2016   Numbness and tingling of right thumb 01/06/2016   Past Medical History:  Diagnosis Date   Chicken pox     Family History  Problem Relation Age of Onset   Diabetes Mother    Hyperlipidemia Mother    Diabetes Maternal Grandmother    Hypertension Maternal Grandfather     Social History   Socioeconomic History   Marital status: Married    Spouse name: Not on file   Number of children: Not on file   Years of education: Not on file   Highest  education level: Not on file  Occupational History   Not on file  Tobacco Use   Smoking status: Never   Smokeless tobacco: Never  Vaping Use   Vaping Use: Never used  Substance and Sexual Activity   Alcohol use: Yes    Comment: occasional   Drug use: No   Sexual activity: Yes    Partners: Female  Other Topics Concern   Not on file  Social History Narrative   Not on file   Social Determinants of Health   Financial Resource Strain: Not on file  Food Insecurity: Not on file  Transportation Needs: Not on file  Physical Activity: Not on file  Stress: Not on file  Social Connections: Not on file  Intimate Partner Violence: Not on file    ROS  General:  Negative for nexplained weight loss, fever Skin: Negative for new or changing mole, sore that won't heal HEENT: Negative for trouble hearing, trouble seeing, ringing in ears, mouth sores, hoarseness, change in voice, dysphagia. CV:  Negative for chest pain, dyspnea, edema, palpitations Resp: Negative for cough, dyspnea, hemoptysis GI: Negative for nausea, vomiting, diarrhea, constipation, abdominal pain, melena, hematochezia. GU: Negative for dysuria, incontinence, urinary hesitance, hematuria, vaginal or penile discharge, polyuria, sexual difficulty, lumps in testicle or breasts MSK: Negative for muscle cramps or aches, joint pain or swelling Neuro: Negative for headaches, weakness, numbness, dizziness, passing out/fainting Psych: Negative for depression, anxiety, memory problems  Objective  Physical Exam Vitals:  07/13/22 1414 07/13/22 1448  BP: (!) 146/98 (!) 160/92  Pulse: 88   Resp: 16   Temp: 98.4 F (36.9 C)   SpO2: 97%     BP Readings from Last 3 Encounters:  07/13/22 (!) 160/92  09/30/21 (!) 154/90  07/09/21 140/80   Wt Readings from Last 3 Encounters:  07/13/22 224 lb (101.6 kg)  09/30/21 215 lb (97.5 kg)  07/09/21 216 lb 9.6 oz (98.2 kg)    Physical Exam Constitutional:      General: He is not  in acute distress.    Appearance: He is not diaphoretic.  HENT:     Head: Normocephalic and atraumatic.     Right Ear: Tympanic membrane normal.     Left Ear: Tympanic membrane normal.  Cardiovascular:     Rate and Rhythm: Normal rate and regular rhythm.     Heart sounds: Normal heart sounds.  Pulmonary:     Effort: Pulmonary effort is normal.     Breath sounds: Normal breath sounds.  Abdominal:     General: Bowel sounds are normal. There is no distension.     Palpations: Abdomen is soft.     Tenderness: There is no abdominal tenderness.  Musculoskeletal:     Right lower leg: No edema.     Left lower leg: No edema.  Lymphadenopathy:     Cervical: No cervical adenopathy.  Skin:    General: Skin is warm and dry.  Neurological:     Mental Status: He is alert.  Psychiatric:        Mood and Affect: Mood normal.      Assessment/Plan:   Encounter for general adult medical examination with abnormal findings Assessment & Plan: Physical exam completed.  Encouraged healthy diet and exercise.  Will refer for colonoscopy.  PSA will be checked today with lab work.  We discussed hepatitis C and HIV screening though he declines as he is low risk.  Flu vaccine given today.  He declines further COVID vaccinations.   Sinus congestion Assessment & Plan: Discussed this possibly could be viral including COVID or could be related to allergies.  Rapid COVID test done today.  We opted to have the patient do another COVID test tomorrow to confirm that it remains negative as opposed to sending a PCR test.  Discussed he could try Allegra or Flonase over-the-counter for his symptoms.  Orders: -     POC COVID-19 BinaxNow  Elevated blood pressure reading Assessment & Plan: Elevated today.  He reports he recently took some cold medication which could be contributing to this.  I will have him return in 3 to 4 weeks for nurse BP check.   Colon cancer screening -     Ambulatory referral to  Gastroenterology  Prostate cancer screening -     PSA  Lipid screening -     Comprehensive metabolic panel -     Lipid panel  BMI 31.0-31.9,adult  Need for immunization against influenza -     Flu Vaccine QUAD 72moIM (Fluarix, Fluzone & Alfiuria Quad PF)  Class 1 obesity without serious comorbidity with body mass index (BMI) of 31.0 to 31.9 in adult, unspecified obesity type -     Hemoglobin A1c    Return in about 3 weeks (around 08/03/2022) for nurse BP check, CPE in one year with PCP.   ETommi Rumps MD LParchment

## 2022-07-13 NOTE — Assessment & Plan Note (Signed)
Physical exam completed.  Encouraged healthy diet and exercise.  Will refer for colonoscopy.  PSA will be checked today with lab work.  We discussed hepatitis C and HIV screening though he declines as he is low risk.  Flu vaccine given today.  He declines further COVID vaccinations.

## 2022-07-13 NOTE — Patient Instructions (Addendum)
Nice to see you. I am good to have you return in a few weeks for nurse blood pressure check after you feeling better and have not been taking any cold medication.  If you could please start checking your blood pressure at home that would be great as well.  Your goal is less than 130/80.  If it remains elevated we may need to consider blood pressure medication. Please do a home covid test tomorrow.

## 2022-07-14 LAB — LIPID PANEL
Cholesterol: 157 mg/dL (ref 0–200)
HDL: 27.9 mg/dL — ABNORMAL LOW (ref >39.00)
LDL Cholesterol: 116 mg/dL — ABNORMAL HIGH (ref 0–99)
NonHDL: 129.08
Total CHOL/HDL Ratio: 6
Triglycerides: 64 mg/dL (ref 0.0–149.0)
VLDL: 12.8 mg/dL (ref 0.0–40.0)

## 2022-07-14 LAB — COMPREHENSIVE METABOLIC PANEL
ALT: 46 U/L (ref 0–53)
AST: 57 U/L — ABNORMAL HIGH (ref 0–37)
Albumin: 3.9 g/dL (ref 3.5–5.2)
Alkaline Phosphatase: 54 U/L (ref 39–117)
BUN: 10 mg/dL (ref 6–23)
CO2: 29 mEq/L (ref 19–32)
Calcium: 9.4 mg/dL (ref 8.4–10.5)
Chloride: 102 mEq/L (ref 96–112)
Creatinine, Ser: 1.2 mg/dL (ref 0.40–1.50)
GFR: 72.36 mL/min (ref >60.00)
Glucose, Bld: 80 mg/dL (ref 70–99)
Potassium: 3.6 mEq/L (ref 3.5–5.1)
Sodium: 139 mEq/L (ref 135–145)
Total Bilirubin: 0.3 mg/dL (ref 0.2–1.2)
Total Protein: 6.9 g/dL (ref 6.0–8.3)

## 2022-07-14 LAB — HEMOGLOBIN A1C: Hgb A1c MFr Bld: 5.1 % (ref 4.6–6.5)

## 2022-07-14 LAB — PSA: PSA: 0.38 ng/mL (ref 0.10–4.00)

## 2022-07-19 ENCOUNTER — Other Ambulatory Visit: Payer: Self-pay

## 2022-07-19 DIAGNOSIS — R7989 Other specified abnormal findings of blood chemistry: Secondary | ICD-10-CM

## 2022-08-01 ENCOUNTER — Ambulatory Visit: Payer: BC Managed Care – PPO

## 2022-08-15 ENCOUNTER — Other Ambulatory Visit: Payer: Self-pay | Admitting: Family Medicine

## 2022-08-15 DIAGNOSIS — F419 Anxiety disorder, unspecified: Secondary | ICD-10-CM

## 2023-02-11 ENCOUNTER — Other Ambulatory Visit: Payer: Self-pay | Admitting: Family Medicine

## 2023-02-11 DIAGNOSIS — F419 Anxiety disorder, unspecified: Secondary | ICD-10-CM

## 2023-03-01 DIAGNOSIS — Z202 Contact with and (suspected) exposure to infections with a predominantly sexual mode of transmission: Secondary | ICD-10-CM | POA: Diagnosis not present

## 2023-03-15 ENCOUNTER — Telehealth: Payer: Self-pay | Admitting: Family Medicine

## 2023-03-15 NOTE — Telephone Encounter (Signed)
Left message to call the office back to see why he needs a referral to [facility] endo?

## 2023-03-15 NOTE — Telephone Encounter (Signed)
Lvm for pt to give office a call, can transfer call to me when pt does call back

## 2023-03-15 NOTE — Telephone Encounter (Signed)
Patient just called back and he said they asked for one.

## 2023-03-15 NOTE — Telephone Encounter (Signed)
Pt called stating he need a referral to [facility] endo

## 2023-03-24 NOTE — Telephone Encounter (Signed)
Reviewed endo note. Pt doesn't need a referral. Pt needs an appt with [facility] in order to continue refills. Closing note due to unable to reach pt

## 2023-06-11 ENCOUNTER — Encounter: Payer: Self-pay | Admitting: Emergency Medicine

## 2023-06-11 ENCOUNTER — Ambulatory Visit
Admission: EM | Admit: 2023-06-11 | Discharge: 2023-06-11 | Disposition: A | Discharge status: Home / Self Care | Payer: BC Managed Care – PPO | Attending: Family Medicine | Admitting: Family Medicine

## 2023-06-11 DIAGNOSIS — M5417 Radiculopathy, lumbosacral region: Secondary | ICD-10-CM | POA: Diagnosis not present

## 2023-06-11 MED ORDER — CYCLOBENZAPRINE HCL 10 MG PO TABS: 10.0000 mg | ORAL_TABLET | Freq: Three times a day (TID) | ORAL | 0 refills | Status: AC | PRN

## 2023-06-11 MED ORDER — PREDNISONE 10 MG (21) PO TBPK: ORAL_TABLET | Freq: Every day | ORAL | 0 refills | Status: AC

## 2023-06-11 MED ORDER — DEXAMETHASONE SODIUM PHOSPHATE 10 MG/ML IJ SOLN
10.0000 mg | Freq: Once | INTRAMUSCULAR | Status: AC
Start: 2023-06-11 — End: 2023-06-11
  Administered 2023-06-11: 10 mg via INTRAMUSCULAR

## 2023-06-11 NOTE — ED Triage Notes (Signed)
 Patient c/o right sided lower back pain that started about a month ago.  Patient denies any injury or fall.  Patient has been taking OTC Ibuprofen.

## 2023-06-11 NOTE — ED Provider Notes (Signed)
 [facility]    CSN: [id number] Arrival date & time: 06/11/23  1036      History   Chief Complaint Chief Complaint  Patient presents with   Back Pain    HPI  HPI Jonathan Day is a 48 y.o. male.   Jonathan Day presents for sciatic nerve pain that has been intermittent for the past month. He has history of similar pain. Tried massage therapy, stretching, ice and heat without relief. Taking ibuprofen 800 mg 3 times a day without improvement. Pain radiates down his right leg. His leg can go numb almost.  He has seen [facility] for his pain in the past but has not done any surgery.      Past Medical History:  Diagnosis Date   Chicken pox    Pituitary tumor     Patient Active Problem List   Diagnosis Date Noted   Encounter for general adult medical examination with abnormal findings 07/13/2022   Sinus congestion 07/13/2022   Low back pain 07/09/2021   Colon cancer screening 01/01/2021   LFTs abnormal 01/01/2021   Lightheadedness 09/27/2020   Anxiety 08/06/2020   Elevated blood pressure reading 08/18/2017   Prostate cancer screening 08/18/2017   Pituitary tumor 02/14/2017   Cervical radiculopathy 02/14/2017    Past Surgical History:  Procedure Laterality Date   APPENDECTOMY  2003   HAND SURGERY Right 2000       Home Medications    Prior to Admission medications   Medication Sig Start Date End Date Taking? Authorizing Provider  cabergoline (DOSTINEX) 0.5 MG tablet Take 0.25 mg by mouth 2 (two) times a week. 09/21/20  Yes [provider]  cyclobenzaprine  (FLEXERIL ) 10 MG tablet Take 1 tablet (10 mg total) by mouth 3 (three) times daily as needed for muscle spasms. 06/11/23  Yes Isom Kochan, DO  predniSONE  (STERAPRED UNI-PAK 21 TAB) 10 MG (21) TBPK tablet Take by mouth daily. Take 6 tabs by mouth daily for 1, then 5 tabs for 1 day, then 4 tabs for 1 day, then 3 tabs for 1 day, then 2 tabs for 1 day, then 1 tab for 1 day. 06/11/23  Yes  Zairah Arista, DO  sertraline  (ZOLOFT ) 50 MG tablet TAKE 1 TABLET BY MOUTH EVERY DAY 02/14/23  Yes Maribeth Camellia MATSU, MD  clonazePAM  (KLONOPIN ) 0.5 MG tablet Take 0.5 tablets (0.25 mg total) by mouth 2 (two) times daily as needed for anxiety. Patient not taking: Reported on 07/13/2022 09/18/20   Maribeth Camellia MATSU, MD    Family History Family History  Problem Relation Age of Onset   Diabetes Mother    Hyperlipidemia Mother    Diabetes Maternal Grandmother    Hypertension Maternal Grandfather     Social History Social History   Tobacco Use   Smoking status: Never   Smokeless tobacco: Never  Vaping Use   Vaping status: Never Used  Substance Use Topics   Alcohol use: Yes    Comment: occasional   Drug use: No     Allergies   Patient has no known allergies.   Review of Systems Review of Systems: egative unless otherwise stated in HPI.      Physical Exam Triage Vital Signs ED Triage Vitals  Encounter Vitals Group     BP 06/11/23 1101 (!) 154/91     Systolic BP Percentile --      Diastolic BP Percentile --      Pulse Rate 06/11/23 1101 70     Resp 06/11/23 1101  15     Temp 06/11/23 1101 99.4 F (37.4 C)     Temp Source 06/11/23 1101 Oral     SpO2 06/11/23 1101 97 %     Weight 06/11/23 1059 223 lb 15.8 oz (101.6 kg)     Height 06/11/23 1059 5' 10 (1.778 m)     Head Circumference --      Peak Flow --      Pain Score 06/11/23 1058 5     Pain Loc --      Pain Education --      Exclude from Growth Chart --    No data found.  Updated Vital Signs BP (!) 154/91 (BP Location: Left Arm)   Pulse 70   Temp 99.4 F (37.4 C) (Oral)   Resp 15   Ht 5' 10 (1.778 m)   Wt 101.6 kg   SpO2 97%   BMI 32.14 kg/m   Visual Acuity Right Eye Distance:   Left Eye Distance:   Bilateral Distance:    Right Eye Near:   Left Eye Near:    Bilateral Near:     Physical Exam GEN: uncomfortable appearing male  CVS: well perfused  RESP: speaking in full sentences without  pause, no respiratory distress  MSK:  Lumbar spine: - Inspection: no gross deformity or asymmetry, swelling or ecchymosis. No skin changes  - Palpation: No midline TTP over the spinous processes, right lumbar paraspinal muscle hypertonicity and tenderness to palpation, right SI joint tenderness  - ROM: good active ROM of the lumbar spine in flexion and extension but with  pain  - Strength: 5/5 strength of lower extremity in L4-S1 nerve root distributions b/l - Neuro: sensation intact in the L4-S1 nerve root distribution b/l - Special testing: right straight leg raise positive  SKIN: warm, dry, no overly skin rash or erythema    UC Treatments / Results  Labs (all labs ordered are listed, but only abnormal results are displayed) Labs Reviewed - No data to display  EKG   Radiology No results found.   Procedures Procedures (including critical care time)  Medications Ordered in UC Medications  dexamethasone  (DECADRON ) injection 10 mg (10 mg Intramuscular Given 06/11/23 1209)    Initial Impression / Assessment and Plan / UC Course  I have reviewed the triage vital signs and the nursing notes.  Pertinent labs & imaging results that were available during my care of the patient were reviewed by me and considered in my medical decision making (see chart for details).      Pt is a 48 y.o.  male with intermittent low back pain for the past month.  He has history of sciatica.  Imaging deferred for now.  Reports having lumbar imaging in the past.  Follows with [facility].  He is not interested in surgery at this time.  Discussed p.o. and IM steroids and he is agreeable.  Decadron  10 mg IM given. Patient to gradually return to normal activities, as tolerated and continue ordinary activities within the limits permitted by pain. Prescribed prednisone  taper and muscle relaxer  for pain relief.  Motrin, Tylenol  and Lidocaine patches PRN for multimodal pain relief. No red flags suggesting cauda  equina syndrome or progressive major motor weakness.  Avoid lifting greater than 25 pounds for the next 2 weeks, if possible.  Patient to follow up with orthopedic provider, if symptoms do not improve with conservative treatment.  Return and ED precautions given.    Discussed MDM, treatment plan and  plan for follow-up with patient who agrees with plan.   Final Clinical Impressions(s) / UC Diagnoses   Final diagnoses:  Lumbosacral radiculopathy     Discharge Instructions      Try not to lift more than 25 pounds for the next 2 weeks.  Take the Flexeril  which is a muscle relaxer mostly at bedtime and if you need to use it throughout the day you can.  Do not drive or operate heavy machinery while taking muscle relaxers.  I also prescribed a course of steroids which should start tomorrow as you got an injection of steroids here. Continue taking the ibuprofen as needed for pain. Tylenol  can provide some additional relief.   Follow-up with [facility], if things do not get better.     ED Prescriptions     Medication Sig Dispense Auth. Provider   cyclobenzaprine  (FLEXERIL ) 10 MG tablet Take 1 tablet (10 mg total) by mouth 3 (three) times daily as needed for muscle spasms. 30 tablet Meghan Tiemann, DO   predniSONE  (STERAPRED UNI-PAK 21 TAB) 10 MG (21) TBPK tablet Take by mouth daily. Take 6 tabs by mouth daily for 1, then 5 tabs for 1 day, then 4 tabs for 1 day, then 3 tabs for 1 day, then 2 tabs for 1 day, then 1 tab for 1 day. 21 tablet Muneer Leider, DO      PDMP not reviewed this encounter.   Kriste Berth, DO 06/11/23 1253

## 2023-06-11 NOTE — Discharge Instructions (Addendum)
 Try not to lift more than 25 pounds for the next 2 weeks.  Take the Flexeril  which is a muscle relaxer mostly at bedtime and if you need to use it throughout the day you can.  Do not drive or operate heavy machinery while taking muscle relaxers.  I also prescribed a course of steroids which should start tomorrow as you got an injection of steroids here. Continue taking the ibuprofen as needed for pain. Tylenol  can provide some additional relief.   Follow-up with [facility], if things do not get better.

## 2023-07-19 ENCOUNTER — Encounter: Payer: BC Managed Care – PPO | Admitting: Nurse Practitioner

## 2023-07-19 ENCOUNTER — Encounter: Payer: BC Managed Care – PPO | Admitting: Family Medicine

## 2023-07-21 DIAGNOSIS — Z13220 Encounter for screening for lipoid disorders: Secondary | ICD-10-CM | POA: Diagnosis not present

## 2023-07-21 DIAGNOSIS — R030 Elevated blood-pressure reading, without diagnosis of hypertension: Secondary | ICD-10-CM | POA: Diagnosis not present

## 2023-07-21 DIAGNOSIS — Z0000 Encounter for general adult medical examination without abnormal findings: Secondary | ICD-10-CM | POA: Diagnosis not present

## 2023-07-21 DIAGNOSIS — M5431 Sciatica, right side: Secondary | ICD-10-CM | POA: Diagnosis not present

## 2023-08-14 ENCOUNTER — Other Ambulatory Visit: Payer: Self-pay

## 2023-08-14 ENCOUNTER — Other Ambulatory Visit: Payer: Self-pay | Admitting: Family Medicine

## 2023-08-14 DIAGNOSIS — F419 Anxiety disorder, unspecified: Secondary | ICD-10-CM

## 2023-08-14 MED ORDER — SERTRALINE HCL 50 MG PO TABS: 50.0000 mg | ORAL_TABLET | Freq: Every day | ORAL | 0 refills | Status: AC

## 2023-08-14 NOTE — Progress Notes (Signed)
 Called pt to advise him that medication has been sent it, and he need a TOC appointment.

## 2023-10-31 DIAGNOSIS — M5416 Radiculopathy, lumbar region: Secondary | ICD-10-CM | POA: Diagnosis not present

## 2023-11-10 ENCOUNTER — Other Ambulatory Visit: Payer: Self-pay | Admitting: Family Medicine

## 2023-11-10 DIAGNOSIS — F419 Anxiety disorder, unspecified: Secondary | ICD-10-CM

## 2023-11-23 DIAGNOSIS — M545 Low back pain, unspecified: Secondary | ICD-10-CM | POA: Diagnosis not present

## 2023-11-23 DIAGNOSIS — S3992XA Unspecified injury of lower back, initial encounter: Secondary | ICD-10-CM | POA: Diagnosis not present

## 2023-11-23 DIAGNOSIS — M4316 Spondylolisthesis, lumbar region: Secondary | ICD-10-CM | POA: Diagnosis not present

## 2023-11-23 DIAGNOSIS — M47816 Spondylosis without myelopathy or radiculopathy, lumbar region: Secondary | ICD-10-CM | POA: Diagnosis not present

## 2023-11-23 DIAGNOSIS — F32A Depression, unspecified: Secondary | ICD-10-CM | POA: Diagnosis not present

## 2023-12-22 DIAGNOSIS — M5416 Radiculopathy, lumbar region: Secondary | ICD-10-CM | POA: Diagnosis not present

## 2024-04-16 NOTE — Telephone Encounter (Signed)
 open in error

## 2024-05-13 DIAGNOSIS — A599 Trichomoniasis, unspecified: Secondary | ICD-10-CM | POA: Diagnosis not present

## 2024-05-15 DIAGNOSIS — Z7251 High risk heterosexual behavior: Secondary | ICD-10-CM | POA: Diagnosis not present
